# Patient Record
Sex: Female | Born: 1935 | Race: White | Hispanic: No | Smoking: Never smoker
Health system: Southern US, Community
[De-identification: ages and names within clinical notes are randomized; demographics above are authoritative.]

## PROBLEM LIST (undated history)

## (undated) DIAGNOSIS — R1310 Dysphagia, unspecified: Secondary | ICD-10-CM

## (undated) DIAGNOSIS — I4891 Unspecified atrial fibrillation: Secondary | ICD-10-CM

## (undated) DIAGNOSIS — Z950 Presence of cardiac pacemaker: Secondary | ICD-10-CM

## (undated) DIAGNOSIS — I10 Essential (primary) hypertension: Secondary | ICD-10-CM

## (undated) DIAGNOSIS — R4702 Dysphasia: Secondary | ICD-10-CM

## (undated) DIAGNOSIS — F0390 Unspecified dementia without behavioral disturbance: Secondary | ICD-10-CM

---

## 2019-11-14 ENCOUNTER — Emergency Department (HOSPITAL_COMMUNITY)
Admission: EM | Admit: 2019-11-14 | Discharge: 2019-11-14 | Disposition: A | Discharge status: Home / Self Care | Payer: Medicare Other | Attending: Emergency Medicine | Admitting: Emergency Medicine

## 2019-11-14 ENCOUNTER — Emergency Department (HOSPITAL_COMMUNITY): Payer: Medicare Other

## 2019-11-14 ENCOUNTER — Other Ambulatory Visit: Payer: Self-pay

## 2019-11-14 DIAGNOSIS — S4991XA Unspecified injury of right shoulder and upper arm, initial encounter: Secondary | ICD-10-CM | POA: Diagnosis present

## 2019-11-14 DIAGNOSIS — S43005A Unspecified dislocation of left shoulder joint, initial encounter: Secondary | ICD-10-CM

## 2019-11-14 DIAGNOSIS — Y92009 Unspecified place in unspecified non-institutional (private) residence as the place of occurrence of the external cause: Secondary | ICD-10-CM | POA: Insufficient documentation

## 2019-11-14 DIAGNOSIS — W108XXA Fall (on) (from) other stairs and steps, initial encounter: Secondary | ICD-10-CM | POA: Insufficient documentation

## 2019-11-14 DIAGNOSIS — W19XXXA Unspecified fall, initial encounter: Secondary | ICD-10-CM

## 2019-11-14 MED ORDER — OXYCODONE-ACETAMINOPHEN 5-325 MG PO TABS
1.0000 | ORAL_TABLET | Freq: Once | ORAL | Status: AC
  Administered 2019-11-14: 1 via ORAL
  Filled 2019-11-14: qty 1

## 2019-11-14 MED ORDER — HYDROCODONE-ACETAMINOPHEN 5-325 MG PO TABS: 1.0000 | ORAL_TABLET | Freq: Four times a day (QID) | ORAL | 0 refills | Status: AC | PRN

## 2019-11-14 MED ORDER — FENTANYL CITRATE (PF) 100 MCG/2ML IJ SOLN
50.0000 ug | Freq: Once | INTRAMUSCULAR | Status: AC
  Administered 2019-11-14: 50 ug via INTRAVENOUS
  Filled 2019-11-14: qty 2

## 2019-11-14 NOTE — Progress Notes (Signed)
Orthopedic Tech Progress Note Patient Details:  Tiffany Cowan Oct 28, 1923 [id number]  Ortho Devices Type of Ortho Device: Arm sling Ortho Device/Splint Location: LUE Ortho Device/Splint Interventions: Application, Adjustment   Post Interventions Patient Tolerated: Well Instructions Provided: Adjustment of device   Tiffany Cowan E [name] 11/14/2019, 9:21 PM

## 2019-11-14 NOTE — Discharge Instructions (Signed)
Please follow-up with orthopedic surgery.  Recommend no weightbearing of your left arm and continue sling until cleared by Ortho.  Take pain medicine as prescribed as needed.

## 2019-11-14 NOTE — ED Notes (Signed)
Patient transported to X-ray 

## 2019-11-15 NOTE — ED Provider Notes (Signed)
[facility] EMERGENCY DEPARTMENT Provider Note   CSN: [id number] Arrival date & time: 11/14/19  1724     History Chief Complaint  Patient presents with  . Fall    Tiffany Cowan is a 86 y.o. female.  Presented to ER with concern for fall.  Reports that this evening she fell into the banister, landed on her left side.  Thinks she may have lost her balance, denied losing any consciousness. No head trauma, no head pain or neck or back pain.  Denies any chest pain, difficulty breathing, abdominal pain, vomiting.  States she is only having shoulder pain.  Pain is worse with movement, sharp, stabbing.  Denies prior history of shoulder injury or prior dislocation.  Lives in independent living facility.  Daughter reports she has good family support and is able to help her mom home tonight, will have mother stay with her this evening.  HPI     No past medical history on file.  There are no problems to display for this patient.     OB History   No obstetric history on file.     No family history on file.  Social History   Tobacco Use  . Smoking status: Not on file  Substance Use Topics  . Alcohol use: Not on file  . Drug use: Not on file    Home Medications Prior to Admission medications   Medication Sig Start Date End Date Taking? Authorizing Provider  HYDROcodone-acetaminophen (NORCO/VICODIN) 5-325 MG tablet Take 1 tablet by mouth every 6 (six) hours as needed for up to 3 days for moderate pain. 11/14/19 11/17/19  Milagros Loll, MD    Allergies    Patient has no allergy information on record.  Review of Systems   Review of Systems  Constitutional: Negative for chills and fever.  HENT: Negative for ear pain and sore throat.   Eyes: Negative for pain and visual disturbance.  Respiratory: Negative for cough and shortness of breath.   Cardiovascular: Negative for chest pain and palpitations.  Gastrointestinal: Negative for abdominal pain and  vomiting.  Genitourinary: Negative for dysuria and hematuria.  Musculoskeletal: Positive for arthralgias. Negative for back pain.  Skin: Negative for color change and rash.  Neurological: Negative for seizures and syncope.  All other systems reviewed and are negative.   Physical Exam Updated Vital Signs BP (!) 149/103   Pulse 72   Temp (!) 97.5 F (36.4 C) (Oral)   Resp (!) 23   Ht 5\' 2"  (1.575 m)   Wt 59 kg   SpO2 95%   BMI 23.78 kg/m   Physical Exam Vitals and nursing note reviewed.  Constitutional:      General: She is not in acute distress.    Appearance: She is well-developed.  HENT:     Head: Normocephalic and atraumatic.  Eyes:     Conjunctiva/sclera: Conjunctivae normal.  Cardiovascular:     Rate and Rhythm: Normal rate and regular rhythm.     Heart sounds: No murmur heard.   Pulmonary:     Effort: Pulmonary effort is normal. No respiratory distress.     Breath sounds: Normal breath sounds.  Abdominal:     Palpations: Abdomen is soft.     Tenderness: There is no abdominal tenderness.  Musculoskeletal:     Cervical back: Neck supple.     Comments: LUE: deformity to L shoulder, ROM to shoulder decreased, no TTP throughout remainder of extremity, normal sensation, radial pulse RUE: no deformity,  no TTP  LLE: no deformity, no TTP RLE: no deformity, no TTP Back: no C,T,L spine TTP  Skin:    General: Skin is warm and dry.     Capillary Refill: Capillary refill takes less than 2 seconds.  Neurological:     General: No focal deficit present.     Mental Status: She is alert and oriented to person, place, and time.     Comments: Normal distal motor and sensation in LUE      ED Results / Procedures / Treatments   Labs (all labs ordered are listed, but only abnormal results are displayed) Labs Reviewed - No data to display  EKG None  Radiology DG Chest 1 View  Result Date: 11/14/2019 CLINICAL DATA:  Fall, left shoulder pain EXAM: CHEST  1 VIEW  COMPARISON:  Shoulder series today FINDINGS: Cardiomegaly. Pacer in place with leads in the right atrium and right ventricle. Aortic atherosclerosis. No confluent opacities or effusions. Anterior dislocation of the left shoulder again noted. IMPRESSION: No acute cardiopulmonary disease. Left shoulder anterior dislocation. Electronically Signed   By: Charlett Nose M.D.   On: 11/14/2019 19:11   DG Shoulder Left  Result Date: 11/14/2019 CLINICAL DATA:  Reduction of anterior dislocation EXAM: LEFT SHOULDER - 2+ VIEW COMPARISON:  11/14/2019 FINDINGS: Frontal, transscapular, and axillary views of the left shoulder are obtained. There is anatomic alignment of the glenohumeral joint. There is moderate osteoarthritis of the acromioclavicular and glenohumeral joints. No fracture. Left chest is clear. IMPRESSION: 1. Reduction of previous glenohumeral dislocation. 2. Moderate osteoarthritis. Electronically Signed   By: Sharlet Salina M.D.   On: 11/14/2019 21:06   DG Shoulder Left  Result Date: 11/14/2019 CLINICAL DATA:  Left shoulder pain, fall EXAM: LEFT SHOULDER - 2+ VIEW COMPARISON:  None. FINDINGS: There is anterior/inferior dislocation at the left glenohumeral joint. No visible fracture. AC joint appears intact with mild degenerative changes. IMPRESSION: Left anterior/inferior dislocation. Electronically Signed   By: Charlett Nose M.D.   On: 11/14/2019 19:10    Procedures .Ortho Injury Treatment  Date/Time: 11/15/2019 4:24 PM Performed by: Milagros Loll, MD Authorized by: Milagros Loll, MD   Consent:    Consent obtained:  Verbal   Consent given by:  Patient   Risks discussed:  Fracture, irreducible dislocation, recurrent dislocation and nerve damage   Alternatives discussed:  No treatment, alternative treatment and immobilizationInjury location: shoulder Location details: left shoulder Injury type: dislocation Dislocation type: anterior Hill-Sachs deformity: no Chronicity:  new Pre-procedure distal perfusion: normal Pre-procedure neurological function: normal Pre-procedure range of motion: normal  Anesthesia: Local anesthesia used: no  Patient sedated: NoManipulation performed: yes Reduction method: Cunningham technique. Reduction successful: yes X-ray confirmed reduction: yes Immobilization: sling Post-procedure neurovascular assessment: post-procedure neurovascularly intact Post-procedure distal perfusion: normal Post-procedure neurological function: normal Post-procedure range of motion: normal Patient tolerance: patient tolerated the procedure well with no immediate complications    (including critical care time)  Medications Ordered in ED Medications  fentaNYL (SUBLIMAZE) injection 50 mcg (50 mcg Intravenous Given 11/14/19 1822)  oxyCODONE-acetaminophen (PERCOCET/ROXICET) 5-325 MG per tablet 1 tablet (1 tablet Oral Given 11/14/19 2014)    ED Course  I have reviewed the triage vital signs and the nursing notes.  Pertinent labs & imaging results that were available during my care of the patient were reviewed by me and considered in my medical decision making (see chart for details).    MDM Rules/Calculators/A&P  86 year old lady presented to ER after mechanical fall.  She is alert and oriented, denied head trauma, LOC.  On head to toe trauma assessment, only traumatic findings were deformed left shoulder.  X-ray consistent with anterior shoulder dislocation.  Shoulder was easily reduced at bedside.  Placed in sling, instructed to follow-up with Ortho.  Daughter at bedside will take patient home with her this evening.  After the discussed management above, the patient was determined to be safe for discharge.  The patient was in agreement with this plan and all questions regarding their care were answered.  ED return precautions were discussed and the patient will return to the ED with any significant worsening of  condition.    Final Clinical Impression(s) / ED Diagnoses Final diagnoses:  Dislocation of left shoulder joint, initial encounter    Rx / DC Orders ED Discharge Orders         Ordered    HYDROcodone-acetaminophen (NORCO/VICODIN) 5-325 MG tablet  Every 6 hours PRN        11/14/19 2118           Milagros Loll, MD 11/15/19 1625

## 2020-01-26 ENCOUNTER — Other Ambulatory Visit: Payer: Self-pay

## 2020-01-26 ENCOUNTER — Emergency Department (HOSPITAL_COMMUNITY): Payer: Medicare Other

## 2020-01-26 ENCOUNTER — Ambulatory Visit (HOSPITAL_COMMUNITY)
Admission: EM | Admit: 2020-01-26 | Discharge: 2020-01-26 | Disposition: A | Discharge status: Other Acute Inpatient Hospital | Payer: Medicare Other

## 2020-01-26 ENCOUNTER — Emergency Department (HOSPITAL_COMMUNITY)
Admission: EM | Admit: 2020-01-26 | Discharge: 2020-01-26 | Disposition: A | Discharge status: Home / Self Care | Payer: Medicare Other | Attending: Emergency Medicine | Admitting: Emergency Medicine

## 2020-01-26 DIAGNOSIS — S0990XA Unspecified injury of head, initial encounter: Secondary | ICD-10-CM | POA: Diagnosis present

## 2020-01-26 DIAGNOSIS — W228XXA Striking against or struck by other objects, initial encounter: Secondary | ICD-10-CM | POA: Insufficient documentation

## 2020-01-26 DIAGNOSIS — S60221A Contusion of right hand, initial encounter: Secondary | ICD-10-CM | POA: Diagnosis not present

## 2020-01-26 DIAGNOSIS — S0003XA Contusion of scalp, initial encounter: Secondary | ICD-10-CM | POA: Diagnosis not present

## 2020-01-26 DIAGNOSIS — I4891 Unspecified atrial fibrillation: Secondary | ICD-10-CM | POA: Insufficient documentation

## 2020-01-26 DIAGNOSIS — Y92129 Unspecified place in nursing home as the place of occurrence of the external cause: Secondary | ICD-10-CM | POA: Diagnosis not present

## 2020-01-26 DIAGNOSIS — Z7901 Long term (current) use of anticoagulants: Secondary | ICD-10-CM | POA: Diagnosis not present

## 2020-01-26 NOTE — ED Notes (Signed)
Patient transported to CT 

## 2020-01-26 NOTE — Progress Notes (Signed)
Chaplain responding to page for Trauma level 2 for pt Tiffany Cowan. Doctor in room with pt and pt's daughter upon arrival. This chaplain spoke with RN who shared that chaplain services were not needed at this time as pt is "stable". Chaplain remains available for follow-up spiritual/emotional support as needed.  Ardath Sax, MDiv     01/26/20 1800  Clinical Encounter Type  Visited With Health care provider  Visit Type Initial;Trauma

## 2020-01-26 NOTE — ED Triage Notes (Signed)
Pt fell at nursing home around 3pm while trying to make her bed.  Hit the back of her head.  Hematoma to back of her head.  + blood thinners.  No LOC.  Level 2 activated.

## 2020-01-26 NOTE — Discharge Instructions (Signed)
Your testing is normal No signs of brain injury or fracture of the hand Tylenol for pain as needed  See your doctor in 1 week for recheck as needed  Ice and rest to the scalp.

## 2020-01-26 NOTE — ED Provider Notes (Signed)
[facility] EMERGENCY DEPARTMENT Provider Note   CSN: [id number] Arrival date & time: 01/26/20  1720     History No chief complaint on file.   Tiffany Cowan is a 87 y.o. female.  HPI    This pt has hx of afib - on xarelto - in assisted care, using her walker and trying to make her bed when she fell backwards and hit her posteiror occiput - no LOC that she remembers - no other sx  She had a noticed hematoma to the posteior scalp - no change in mental status, no vomiting, no visual changes - occurred a short time ago - sx are mild and worse with palpation over the hematoma - no break in the skin.   No past medical history on file.  There are no problems to display for this patient.    OB History   No obstetric history on file.     No family history on file.     Home Medications Prior to Admission medications   Medication Sig Start Date End Date Taking? Authorizing Provider  cloNIDine (CATAPRES) 0.1 MG tablet Take 0.1 mg by mouth daily as needed. 11/23/19  Yes [provider]  LORazepam (ATIVAN) 1 MG tablet Take 1 mg by mouth at bedtime. 01/18/20  Yes [provider]  pantoprazole (PROTONIX) 40 MG tablet Take 40 mg by mouth daily. 01/22/20  Yes [provider]  rivaroxaban (XARELTO) 20 MG TABS tablet Take 20 mg by mouth daily.   Yes [provider]  gabapentin (NEURONTIN) 100 MG capsule Take 100 mg by mouth at bedtime. 01/13/20   [provider]    Allergies    Azithromycin and Penicillins  Review of Systems   Review of Systems  All other systems reviewed and are negative.   Physical Exam Updated Vital Signs BP (!) 193/90   Pulse 76   Temp 98.4 F (36.9 C) (Oral)   Resp (!) 22   Ht 1.6 m (5\' 3" )   Wt 59 kg   SpO2 96%   BMI 23.03 kg/m   Physical Exam Vitals and nursing note reviewed.  Constitutional:      Appearance: She is well-developed and well-nourished. She is not diaphoretic.  HENT:      Head: Normocephalic.     Comments: 4 cm in diameter hematoma to the occipital scalp.  No other trauma to the head, normal facial symmetry without bruising or trauma, or malocclusion. Eyes:     General:        Right eye: No discharge.        Left eye: No discharge.     Conjunctiva/sclera: Conjunctivae normal.  Cardiovascular:     Rate and Rhythm: Normal rate and regular rhythm.  Pulmonary:     Effort: Pulmonary effort is normal. No respiratory distress.  Abdominal:     General: Abdomen is flat.     Tenderness: There is no abdominal tenderness.  Musculoskeletal:        General: Tenderness ( ttp and bruising over the R hand - thumb and 2nd MCP.) present. No swelling.     Right lower leg: No edema.     Left lower leg: No edema.     Comments: All other joints are supple and compartments are soft - no ttp over the spine  Skin:    General: Skin is warm and dry.     Findings: No erythema or rash.  Neurological:     Mental Status: She  is alert.     Coordination: Coordination normal.     Comments: Follows commands - baseline hard of hearing - normal strength in all 4 extremities.  Psychiatric:        Mood and Affect: Mood and affect normal.     ED Results / Procedures / Treatments   Labs (all labs ordered are listed, but only abnormal results are displayed) Labs Reviewed - No data to display  EKG EKG Interpretation  Date/Time:  Saturday January 26 2020 18:28:10 EST Ventricular Rate:  72 PR Interval:    QRS Duration: 146 QT Interval:  442 QTC Calculation: 484 R Axis:   -64 Text Interpretation: Sinus rhythm Left bundle branch block No old tracing to compare Confirmed by Eber Hong ([id number]) on 01/26/2020 6:35:42 PM   Radiology CT Head Wo Contrast  Result Date: 01/26/2020 CLINICAL DATA:  Head injury after fall. EXAM: CT HEAD WITHOUT CONTRAST TECHNIQUE: Contiguous axial images were obtained from the base of the skull through the vertex without intravenous contrast. COMPARISON:   None. FINDINGS: Brain: Mild chronic ischemic white matter disease is noted. No mass effect or midline shift is noted. Ventricular size is within normal limits. There is no evidence of mass lesion, hemorrhage or acute infarction. Vascular: No hyperdense vessel or unexpected calcification. Skull: Normal. Negative for fracture or focal lesion. Sinuses/Orbits: No acute finding. Other: Small right posterior scalp hematoma is noted. IMPRESSION: Mild chronic ischemic white matter disease. Small right posterior scalp hematoma. No acute intracranial abnormality seen. Electronically Signed   By: Lupita Raider M.D.   On: 01/26/2020 19:20   DG Hand Complete Right  Result Date: 01/26/2020 CLINICAL DATA:  Right hand pain after fall. EXAM: RIGHT HAND - COMPLETE 3+ VIEW COMPARISON:  None. FINDINGS: There is no evidence of fracture or dislocation. There is no evidence of arthropathy or other focal bone abnormality. Soft tissues are unremarkable. IMPRESSION: Negative. Electronically Signed   By: Lupita Raider M.D.   On: 01/26/2020 19:23    Procedures Procedures (including critical care time)  Medications Ordered in ED Medications - No data to display  ED Course  I have reviewed the triage vital signs and the nursing notes.  Pertinent labs & imaging results that were available during my care of the patient were reviewed by me and considered in my medical decision making (see chart for details).  Clinical Course as of 01/26/20 1954  Sat Jan 26, 2020  1853 Blood pressure improved at 189/78.  EKG unremarkable [BM]    Clinical Course User Index [BM] Eber Hong, MD   MDM Rules/Calculators/A&P                          This patient will need an evaluation for possible intracranial injury given her anticoagulated state with a hematoma from the fall.  She is otherwise well-appearing with normal mental status.  She is hypertensive but this is not unusual per the daughter and in this acute situation brought to  the ER I do not think this is part of her condition.  She is not tachycardic and has no other symptoms.  She is stable appearing at this time  CT pending  Personally viewed and interpreted the CT scan and the x-ray of the hand, there is no signs of fractures dislocations bleeding or other significant findings.  I agree with the radiologist.  This patient is stable for discharge  Final Clinical Impression(s) / ED Diagnoses Final diagnoses:  Hematoma of occipital region of scalp  Minor head injury, initial encounter  Contusion of right hand, initial encounter    Rx / DC Orders ED Discharge Orders    None       Eber Hong, MD 01/26/20 1954

## 2020-05-13 ENCOUNTER — Emergency Department (HOSPITAL_COMMUNITY)
Admission: EM | Admit: 2020-05-13 | Discharge: 2020-05-13 | Disposition: A | Discharge status: Home / Self Care | Payer: Medicare Other | Attending: Emergency Medicine | Admitting: Emergency Medicine

## 2020-05-13 ENCOUNTER — Encounter (HOSPITAL_COMMUNITY): Payer: Self-pay

## 2020-05-13 ENCOUNTER — Emergency Department (HOSPITAL_COMMUNITY): Payer: Medicare Other

## 2020-05-13 DIAGNOSIS — F0390 Unspecified dementia without behavioral disturbance: Secondary | ICD-10-CM | POA: Insufficient documentation

## 2020-05-13 DIAGNOSIS — I10 Essential (primary) hypertension: Secondary | ICD-10-CM | POA: Diagnosis not present

## 2020-05-13 DIAGNOSIS — Z79899 Other long term (current) drug therapy: Secondary | ICD-10-CM | POA: Insufficient documentation

## 2020-05-13 DIAGNOSIS — S0003XA Contusion of scalp, initial encounter: Secondary | ICD-10-CM | POA: Insufficient documentation

## 2020-05-13 DIAGNOSIS — G8929 Other chronic pain: Secondary | ICD-10-CM | POA: Insufficient documentation

## 2020-05-13 DIAGNOSIS — W19XXXA Unspecified fall, initial encounter: Secondary | ICD-10-CM | POA: Insufficient documentation

## 2020-05-13 DIAGNOSIS — Z7901 Long term (current) use of anticoagulants: Secondary | ICD-10-CM | POA: Diagnosis not present

## 2020-05-13 DIAGNOSIS — I4891 Unspecified atrial fibrillation: Secondary | ICD-10-CM | POA: Insufficient documentation

## 2020-05-13 DIAGNOSIS — M549 Dorsalgia, unspecified: Secondary | ICD-10-CM | POA: Insufficient documentation

## 2020-05-13 DIAGNOSIS — Y9301 Activity, walking, marching and hiking: Secondary | ICD-10-CM | POA: Diagnosis not present

## 2020-05-13 DIAGNOSIS — S0990XA Unspecified injury of head, initial encounter: Secondary | ICD-10-CM | POA: Diagnosis present

## 2020-05-13 HISTORY — DX: Unspecified atrial fibrillation: I48.91

## 2020-05-13 HISTORY — DX: Presence of cardiac pacemaker: Z95.0

## 2020-05-13 HISTORY — DX: Dysphasia: R47.02

## 2020-05-13 HISTORY — DX: Unspecified dementia, unspecified severity, without behavioral disturbance, psychotic disturbance, mood disturbance, and anxiety: F03.90

## 2020-05-13 HISTORY — DX: Essential (primary) hypertension: I10

## 2020-05-13 MED ORDER — ACETAMINOPHEN 325 MG PO TABS
650.0000 mg | ORAL_TABLET | Freq: Once | ORAL | Status: AC
  Administered 2020-05-13: 650 mg via ORAL
  Filled 2020-05-13: qty 2

## 2020-05-13 NOTE — ED Provider Notes (Signed)
Emergency Medicine Provider Triage Evaluation Note  Tiffany Cowan 87 y.o. female was evaluated in triage. Patient comes from [facility].  Pt complains of an unwitnessed fall that occurred today. Patient is not on blood thinners. She has a history of dementia and is at baseline. EMS reports hematoma noted to the head.   Review of Systems  Positive: fall Negative: N/A  Physical Exam  BP 134/82   Pulse 70   Temp 98.2 F (36.8 C) (Oral)   Resp 18   Ht 5\' 4"  (1.626 m)   Wt 65.8 kg   SpO2 100%   BMI 24.89 kg/m  Gen:   Awake, no distress   HEENT:  Hematoma noted to the right posterior head. No underlying skull deformity or crepitus. Full flexion/extension and lateral movement of neck fully intact. No bony midline tenderness. No deformities or crepitus.  Resp:  Normal effort  Cardiac:  Normal rate  Abd:   Nondistended, nontender  MSK:   Moves extremities without difficulty  Neuro:  Speech clear. CN III-XII intact. 5/5 BUE and BLE.   Medical Decision Making  Medically screening exam initiated at 3:55 AM.  Appropriate orders placed.  Tiffany Cowan was informed that the remainder of the evaluation will be completed by another provider, this initial triage assessment does not replace that evaluation, and the importance of remaining in the ED until their evaluation is complete.  Clinical Impression  Fall   Portions of this note were generated with Dragon dictation software. Dictation errors may occur despite best attempts at proofreading.     Tinnie Gens, PA-C 05/13/20 1655    07/13/20, MD 05/16/20 [phone]

## 2020-05-13 NOTE — Discharge Instructions (Signed)
Take Tylenol for headaches.  Your CT scan today was unremarkable   Fall precaution  Return to ER if you have worse headaches, vomiting, another fall

## 2020-05-13 NOTE — ED Provider Notes (Signed)
[facility]-EMERGENCY DEPT Provider Note   CSN: [id number] Arrival date & time: 05/13/20  1640     History Chief Complaint  Patient presents with  . Fall    Tiffany Cowan is a 87 y.o. female history of dementia, hypertension here presenting with fall.  Patient is from assisted living.  Apparently she was walking back to her room and she states that she had missed the bed and fell backwards.  She was noted to have a hematoma in the posterior scalp area.  She is no longer on blood thinners.  She denies any headaches and per the daughter, patient's mental status is at baseline.  Patient has chronic back pain but denies any back injury.  Patient was ambulatory after the incident.   The history is provided by the patient and a relative.       Past Medical History:  Diagnosis Date  . A-fib (HCC)   . Dementia (HCC)   . Dysphasia   . Hypertension   . Pacemaker     There are no problems to display for this patient.   History reviewed. No pertinent surgical history.   OB History   No obstetric history on file.     History reviewed. No pertinent family history.     Home Medications Prior to Admission medications   Medication Sig Start Date End Date Taking? Authorizing Provider  acetaminophen (TYLENOL) 325 MG tablet Take 650 mg by mouth every 6 (six) hours as needed for mild pain or moderate pain.    [provider]  albuterol (VENTOLIN HFA) 108 (90 Base) MCG/ACT inhaler Inhale 2 puffs into the lungs 2 (two) times daily as needed for wheezing or shortness of breath.    [provider]  amiodarone (PACERONE) 200 MG tablet Take 100 mg by mouth daily.    [provider]  Capsaicin (ASPERCREME PAIN RELIEF PATCH EX) Apply 1 patch topically daily. Apply to lower back    [provider]  cloNIDine (CATAPRES) 0.1 MG tablet Take 0.1 mg by mouth daily as needed. 11/23/19   [provider]  dextromethorphan (DELSYM) 30 MG/5ML  liquid Take 15 mg by mouth every 8 (eight) hours as needed for cough.    [provider]  gabapentin (NEURONTIN) 100 MG capsule Take 100 mg by mouth at bedtime. 01/13/20   [provider]  guaiFENesin (MUCINEX) 600 MG 12 hr tablet Take 600 mg by mouth 2 (two) times daily as needed for cough.    [provider]  levothyroxine (SYNTHROID) 50 MCG tablet Take 50 mcg by mouth daily.    [provider]  LORazepam (ATIVAN) 1 MG tablet Take 1 mg by mouth at bedtime. 01/18/20   [provider]  Menthol, Topical Analgesic, (BIOFREEZE) 4 % GEL Apply 1 application topically 2 (two) times daily as needed (pain). Apply to lower back    [provider]  metoprolol tartrate (LOPRESSOR) 25 MG tablet Take 25 mg by mouth 2 (two) times daily.    [provider]  pantoprazole (PROTONIX) 40 MG tablet Take 40 mg by mouth daily. 01/22/20   [provider]  Psyllium (METAMUCIL) 0.36 g CAPS Take 1 capsule by mouth at bedtime.    [provider]  rivaroxaban (XARELTO) 20 MG TABS tablet Take 20 mg by mouth daily.    [provider]    Allergies    Azithromycin and Penicillins  Review of Systems   Review of Systems  HENT:  Scalp hematoma   All other systems reviewed and are negative.   Physical Exam Updated Vital Signs BP (!) 193/83 (BP Location: Left Arm)   Pulse 73   Temp 98.1 F (36.7 C) (Oral)   Resp 16   SpO2 98%   Physical Exam Vitals and nursing note reviewed.  Constitutional:      Appearance: Normal appearance.  HENT:     Head: Normocephalic.     Comments: + posterior scalp hematoma     Nose: Nose normal.     Mouth/Throat:     Mouth: Mucous membranes are moist.  Eyes:     Extraocular Movements: Extraocular movements intact.     Pupils: Pupils are equal, round, and reactive to light.  Neck:     Comments: No midline tenderness Cardiovascular:     Rate and Rhythm: Normal rate and regular rhythm.      Pulses: Normal pulses.     Heart sounds: Normal heart sounds.  Pulmonary:     Effort: Pulmonary effort is normal.     Breath sounds: Normal breath sounds.  Abdominal:     General: Abdomen is flat.     Palpations: Abdomen is soft.  Musculoskeletal:        General: Normal range of motion.     Cervical back: Normal range of motion.     Comments: No obvious midline tenderness.  Obvious extremity trauma  Skin:    General: Skin is warm.     Capillary Refill: Capillary refill takes less than 2 seconds.  Neurological:     Mental Status: She is alert.     Comments: ANO x2, patient has no obvious facial droop.  Normal strength and sensation bilateral upper and lower extremities  Psychiatric:        Mood and Affect: Mood normal.        Behavior: Behavior normal.     ED Results / Procedures / Treatments   Labs (all labs ordered are listed, but only abnormal results are displayed) Labs Reviewed - No data to display  EKG None  Radiology CT Head Wo Contrast  Result Date: 05/13/2020 CLINICAL DATA:  Head trauma, fall EXAM: CT HEAD WITHOUT CONTRAST CT CERVICAL SPINE WITHOUT CONTRAST TECHNIQUE: Multidetector CT imaging of the head and cervical spine was performed following the standard protocol without intravenous contrast. Multiplanar CT image reconstructions of the cervical spine were also generated. COMPARISON:  CT brain 01/26/2020 FINDINGS: CT HEAD FINDINGS Brain: No acute territorial infarction, hemorrhage or intracranial mass. Moderate atrophy. Advanced white matter disease likely chronic small vessel ischemic change. Stable ventricle size. Vascular: No hyperdense vessels.  Carotid vascular calcification Skull: Normal. Negative for fracture or focal lesion. Sinuses/Orbits: No acute finding. Other: Right posterior scalp hematoma CT CERVICAL SPINE FINDINGS Alignment: Reversal of cervical lordosis. 2 mm anterolisthesis C2 on C3, trace retrolisthesis C4 on C5. Facet alignment is maintained. Skull  base and vertebrae: No acute fracture. No primary bone lesion or focal pathologic process. Soft tissues and spinal canal: No prevertebral fluid or swelling. No visible canal hematoma. Disc levels: Moderate severe diffuse degenerative changes C3 through C7. Hypertrophic facet degenerative changes at multiple levels with foraminal stenosis. Upper chest: Negative. Other: None IMPRESSION: 1. No CT evidence for acute intracranial abnormality. Atrophy and chronic small vessel ischemic changes of the white matter. Right posterior scalp hematoma 2. Reversal of cervical lordosis with moderate severe diffuse degenerative changes. No acute osseous abnormality. Electronically Signed   By: Jasmine Pang M.D.   On: 05/13/2020  18:48   CT Cervical Spine Wo Contrast  Result Date: 05/13/2020 CLINICAL DATA:  Head trauma, fall EXAM: CT HEAD WITHOUT CONTRAST CT CERVICAL SPINE WITHOUT CONTRAST TECHNIQUE: Multidetector CT imaging of the head and cervical spine was performed following the standard protocol without intravenous contrast. Multiplanar CT image reconstructions of the cervical spine were also generated. COMPARISON:  CT brain 01/26/2020 FINDINGS: CT HEAD FINDINGS Brain: No acute territorial infarction, hemorrhage or intracranial mass. Moderate atrophy. Advanced white matter disease likely chronic small vessel ischemic change. Stable ventricle size. Vascular: No hyperdense vessels.  Carotid vascular calcification Skull: Normal. Negative for fracture or focal lesion. Sinuses/Orbits: No acute finding. Other: Right posterior scalp hematoma CT CERVICAL SPINE FINDINGS Alignment: Reversal of cervical lordosis. 2 mm anterolisthesis C2 on C3, trace retrolisthesis C4 on C5. Facet alignment is maintained. Skull base and vertebrae: No acute fracture. No primary bone lesion or focal pathologic process. Soft tissues and spinal canal: No prevertebral fluid or swelling. No visible canal hematoma. Disc levels: Moderate severe diffuse  degenerative changes C3 through C7. Hypertrophic facet degenerative changes at multiple levels with foraminal stenosis. Upper chest: Negative. Other: None IMPRESSION: 1. No CT evidence for acute intracranial abnormality. Atrophy and chronic small vessel ischemic changes of the white matter. Right posterior scalp hematoma 2. Reversal of cervical lordosis with moderate severe diffuse degenerative changes. No acute osseous abnormality. Electronically Signed   By: Jasmine Pang M.D.   On: 05/13/2020 18:48    Procedures Procedures   Medications Ordered in ED Medications  acetaminophen (TYLENOL) tablet 650 mg (650 mg Oral Given 05/13/20 1812)    ED Course  I have reviewed the triage vital signs and the nursing notes.  Pertinent labs & imaging results that were available during my care of the patient were reviewed by me and considered in my medical decision making (see chart for details).    MDM Rules/Calculators/A&P                         Suni Jarnagin is a 87 y.o. female here presenting with fall.  Had a mechanical fall and has a scalp hematoma.  Patient is at baseline mental status.  Will get CT head and neck.  7:03 PM CT unremarkable.  Stable for discharge back to facility  Final Clinical Impression(s) / ED Diagnoses Final diagnoses:  None    Rx / DC Orders ED Discharge Orders    None       Charlynne Pander, MD 05/13/20 1904

## 2020-05-13 NOTE — ED Triage Notes (Signed)
Pt presents with c/o fall that occurred today. Pt is from [facility]. Unwitnessed fall, pt has a hematoma to the back right side of her head. Pt does have dementia, is at her baseline at this time, no blood thinners.

## 2020-05-26 ENCOUNTER — Emergency Department (HOSPITAL_COMMUNITY): Payer: Medicare Other

## 2020-05-26 ENCOUNTER — Encounter (HOSPITAL_COMMUNITY): Payer: Self-pay | Admitting: Emergency Medicine

## 2020-05-26 ENCOUNTER — Other Ambulatory Visit: Payer: Self-pay

## 2020-05-26 ENCOUNTER — Emergency Department (HOSPITAL_COMMUNITY)
Admission: EM | Admit: 2020-05-26 | Discharge: 2020-05-26 | Disposition: A | Discharge status: Home / Self Care | Payer: Medicare Other | Attending: Emergency Medicine | Admitting: Emergency Medicine

## 2020-05-26 DIAGNOSIS — N390 Urinary tract infection, site not specified: Secondary | ICD-10-CM | POA: Insufficient documentation

## 2020-05-26 DIAGNOSIS — F909 Attention-deficit hyperactivity disorder, unspecified type: Secondary | ICD-10-CM | POA: Insufficient documentation

## 2020-05-26 DIAGNOSIS — Z79899 Other long term (current) drug therapy: Secondary | ICD-10-CM | POA: Insufficient documentation

## 2020-05-26 DIAGNOSIS — F419 Anxiety disorder, unspecified: Secondary | ICD-10-CM | POA: Insufficient documentation

## 2020-05-26 DIAGNOSIS — R339 Retention of urine, unspecified: Secondary | ICD-10-CM | POA: Insufficient documentation

## 2020-05-26 DIAGNOSIS — F0390 Unspecified dementia without behavioral disturbance: Secondary | ICD-10-CM | POA: Insufficient documentation

## 2020-05-26 DIAGNOSIS — Z7901 Long term (current) use of anticoagulants: Secondary | ICD-10-CM | POA: Diagnosis not present

## 2020-05-26 DIAGNOSIS — R4182 Altered mental status, unspecified: Secondary | ICD-10-CM | POA: Diagnosis present

## 2020-05-26 DIAGNOSIS — F0391 Unspecified dementia with behavioral disturbance: Secondary | ICD-10-CM

## 2020-05-26 DIAGNOSIS — B9689 Other specified bacterial agents as the cause of diseases classified elsewhere: Secondary | ICD-10-CM | POA: Insufficient documentation

## 2020-05-26 DIAGNOSIS — I10 Essential (primary) hypertension: Secondary | ICD-10-CM | POA: Diagnosis not present

## 2020-05-26 LAB — CBG MONITORING, ED: Glucose-Capillary: 108 mg/dL — ABNORMAL HIGH (ref 70–99)

## 2020-05-26 LAB — COMPREHENSIVE METABOLIC PANEL
ALT: 13 U/L (ref 0–44)
AST: 19 U/L (ref 15–41)
Albumin: 4.1 g/dL (ref 3.5–5.0)
Alkaline Phosphatase: 43 U/L (ref 38–126)
Anion gap: 9 (ref 5–15)
BUN: 36 mg/dL — ABNORMAL HIGH (ref 8–23)
CO2: 25 mmol/L (ref 22–32)
Calcium: 9.4 mg/dL (ref 8.9–10.3)
Chloride: 101 mmol/L (ref 98–111)
Creatinine, Ser: 1.35 mg/dL — ABNORMAL HIGH (ref 0.44–1.00)
GFR, Estimated: 36 mL/min — ABNORMAL LOW (ref >60)
Glucose, Bld: 112 mg/dL — ABNORMAL HIGH (ref 70–99)
Potassium: 4.2 mmol/L (ref 3.5–5.1)
Sodium: 135 mmol/L (ref 135–145)
Total Bilirubin: 0.5 mg/dL (ref 0.3–1.2)
Total Protein: 7 g/dL (ref 6.5–8.1)

## 2020-05-26 LAB — URINALYSIS, ROUTINE W REFLEX MICROSCOPIC
Bilirubin Urine: NEGATIVE
Glucose, UA: NEGATIVE mg/dL
Hgb urine dipstick: NEGATIVE
Ketones, ur: 5 mg/dL — AB
Nitrite: NEGATIVE
Protein, ur: NEGATIVE mg/dL
Specific Gravity, Urine: 1.013 (ref 1.005–1.030)
pH: 5 (ref 5.0–8.0)

## 2020-05-26 LAB — CBC
HCT: 39.4 % (ref 36.0–46.0)
Hemoglobin: 13 g/dL (ref 12.0–15.0)
MCH: 30.9 pg (ref 26.0–34.0)
MCHC: 33 g/dL (ref 30.0–36.0)
MCV: 93.6 fL (ref 80.0–100.0)
Platelets: 250 10*3/uL (ref 150–400)
RBC: 4.21 MIL/uL (ref 3.87–5.11)
RDW: 13.4 % (ref 11.5–15.5)
WBC: 8.1 10*3/uL (ref 4.0–10.5)
nRBC: 0 % (ref 0.0–0.2)

## 2020-05-26 MED ORDER — METOPROLOL TARTRATE 25 MG PO TABS
25.0000 mg | ORAL_TABLET | Freq: Once | ORAL | Status: AC
  Administered 2020-05-26: 25 mg via ORAL
  Filled 2020-05-26: qty 1

## 2020-05-26 MED ORDER — LORAZEPAM 2 MG/ML IJ SOLN
1.0000 mg | Freq: Once | INTRAMUSCULAR | Status: AC
  Administered 2020-05-26: 1 mg via INTRAMUSCULAR
  Filled 2020-05-26: qty 1

## 2020-05-26 MED ORDER — LORAZEPAM 1 MG PO TABS: 1.0000 mg | ORAL_TABLET | Freq: Three times a day (TID) | ORAL | 0 refills | Status: AC | PRN

## 2020-05-26 NOTE — ED Notes (Signed)
Pt placed on purewick 

## 2020-05-26 NOTE — ED Provider Notes (Addendum)
[facility]-EMERGENCY DEPT Provider Note   CSN: [id number] Arrival date & time: 05/26/20  1455     History Chief Complaint  Patient presents with  . Altered Mental Status    Tiffany Cowan is a 87 y.o. female.  87 year old female with history of A. fib on Xarelto and dementia who presents with altered mental status involving increased tremors.  According to her daughter, patient is seem more scared and has had increased anxiety.  Seen in the ED 13 days ago after a fall and had a negative head CT.  Patient has had some issues with recurrent UTIs due to urinary retention.  No reported fever, emesis.  Although patient does have dementia, patient cannot carry conversation at baseline.  Today daughter felt that her behavior was a lot different than yesterday        Past Medical History:  Diagnosis Date  . A-fib (HCC)   . Dementia (HCC)   . Dysphasia   . Hypertension   . Pacemaker     There are no problems to display for this patient.   History reviewed. No pertinent surgical history.   OB History   No obstetric history on file.     History reviewed. No pertinent family history.  Social History   Tobacco Use  . Smoking status: Never Smoker  . Smokeless tobacco: Never Used  Substance Use Topics  . Alcohol use: Not Currently  . Drug use: Never    Home Medications Prior to Admission medications   Medication Sig Start Date End Date Taking? Authorizing Provider  acetaminophen (TYLENOL) 325 MG tablet Take 650 mg by mouth every 6 (six) hours as needed for mild pain or moderate pain.    [provider]  albuterol (VENTOLIN HFA) 108 (90 Base) MCG/ACT inhaler Inhale 2 puffs into the lungs 2 (two) times daily as needed for wheezing or shortness of breath.    [provider]  amiodarone (PACERONE) 200 MG tablet Take 100 mg by mouth daily.    [provider]  Capsaicin (ASPERCREME PAIN RELIEF PATCH EX) Apply 1 patch topically  daily. Apply to lower back    [provider]  cloNIDine (CATAPRES) 0.1 MG tablet Take 0.1 mg by mouth daily as needed. 11/23/19   [provider]  dextromethorphan (DELSYM) 30 MG/5ML liquid Take 15 mg by mouth every 8 (eight) hours as needed for cough.    [provider]  gabapentin (NEURONTIN) 100 MG capsule Take 100 mg by mouth at bedtime. 01/13/20   [provider]  guaiFENesin (MUCINEX) 600 MG 12 hr tablet Take 600 mg by mouth 2 (two) times daily as needed for cough.    [provider]  levothyroxine (SYNTHROID) 50 MCG tablet Take 50 mcg by mouth daily.    [provider]  LORazepam (ATIVAN) 1 MG tablet Take 1 mg by mouth at bedtime. 01/18/20   [provider]  Menthol, Topical Analgesic, (BIOFREEZE) 4 % GEL Apply 1 application topically 2 (two) times daily as needed (pain). Apply to lower back    [provider]  metoprolol tartrate (LOPRESSOR) 25 MG tablet Take 25 mg by mouth 2 (two) times daily.    [provider]  pantoprazole (PROTONIX) 40 MG tablet Take 40 mg by mouth daily. 01/22/20   [provider]  Psyllium (METAMUCIL) 0.36 g CAPS Take 1 capsule by mouth at bedtime.    [provider]  rivaroxaban (XARELTO) 20 MG TABS tablet Take 20 mg by  mouth daily.    [provider]    Allergies    Azithromycin and Penicillins  Review of Systems   Review of Systems  All other systems reviewed and are negative.   Physical Exam Updated Vital Signs BP (!) 214/94 (BP Location: Right Arm)   Pulse 77   Temp 98.3 F (36.8 C) (Oral)   Resp 18   Ht 1.575 m (5\' 2" )   Wt 56.7 kg   SpO2 99%   BMI 22.86 kg/m   Physical Exam Vitals and nursing note reviewed.  Constitutional:      General: She is not in acute distress.    Appearance: Normal appearance. She is well-developed. She is not toxic-appearing.  HENT:     Head: Normocephalic and atraumatic.  Eyes:     General: Lids are normal.      Conjunctiva/sclera: Conjunctivae normal.     Pupils: Pupils are equal, round, and reactive to light.  Neck:     Thyroid: No thyroid mass.     Trachea: No tracheal deviation.  Cardiovascular:     Rate and Rhythm: Normal rate and regular rhythm.     Heart sounds: Normal heart sounds. No murmur heard. No gallop.   Pulmonary:     Effort: Pulmonary effort is normal. No respiratory distress.     Breath sounds: Normal breath sounds. No stridor. No decreased breath sounds, wheezing, rhonchi or rales.  Abdominal:     General: Bowel sounds are normal. There is no distension.     Palpations: Abdomen is soft.     Tenderness: There is no abdominal tenderness. There is no rebound.  Musculoskeletal:        General: No tenderness. Normal range of motion.     Cervical back: Normal range of motion and neck supple.  Skin:    General: Skin is warm and dry.     Findings: No abrasion or rash.  Neurological:     General: No focal deficit present.     Mental Status: She is alert and oriented to person, place, and time. Mental status is at baseline.     GCS: GCS eye subscore is 4. GCS verbal subscore is 5. GCS motor subscore is 6.     Cranial Nerves: No cranial nerve deficit.     Sensory: No sensory deficit.  Psychiatric:        Attention and Perception: Attention normal.        Mood and Affect: Mood is anxious.        Speech: Speech is rapid and pressured.        Behavior: Behavior is hyperactive. Behavior is not combative.     ED Results / Procedures / Treatments   Labs (all labs ordered are listed, but only abnormal results are displayed) Labs Reviewed  CBG MONITORING, ED - Abnormal; Notable for the following components:      Result Value   Glucose-Capillary 108 (*)    All other components within normal limits  URINE CULTURE  CBC  COMPREHENSIVE METABOLIC PANEL  URINALYSIS, ROUTINE W REFLEX MICROSCOPIC    EKG None  Radiology No results found.  Procedures Procedures    Medications Ordered in ED Medications  LORazepam (ATIVAN) injection 1 mg (has no administration in time range)    ED Course  I have reviewed the triage vital signs and the nursing notes.  Pertinent labs & imaging results that were available during my care of the patient were reviewed by me and considered in my  medical decision making (see chart for details).    MDM Rules/Calculators/A&P                          Urinalysis negative for infection here.  Labs are reassuring here.  Head CT negative.  Given Ativan and is much more calm and cooperative at this time.  Patient is sitting up in bed and eating a female without assistance.  Will recommend Ativan as needed as she only takes it now at night help her sleep.  Willing to follow-up with her doctor Final Clinical Impression(s) / ED Diagnoses Final diagnoses:  None    Rx / DC Orders ED Discharge Orders    None       Lorre Nick, MD 05/26/20 0938    Lorre Nick, MD 05/26/20 2056

## 2020-05-26 NOTE — ED Triage Notes (Signed)
Pt arrived via EMS from [facility]. Pt has had increased tremors for the past few days and that she has been acting paranoid. Pt's daughter states that she has weakness and cannot stand and pivot. Pt has hx of UTIs. Pt had a negative stroke screen per EMS. Pt is A&Ox2.

## 2020-05-26 NOTE — ED Triage Notes (Addendum)
Per EMS, pt from [facility]. Daughter reports she has increased tremors and was acting more scared than usual. Pt reports back pain and has been urinating more than normal for the past 3 weeks.   BP 186/90 HR 70 RR 18 SpO2 97 CBG 120

## 2020-05-28 LAB — URINE CULTURE: Culture: <10000 — AB

## 2021-02-12 ENCOUNTER — Encounter (HOSPITAL_COMMUNITY): Payer: Self-pay | Admitting: Internal Medicine

## 2021-02-12 ENCOUNTER — Other Ambulatory Visit: Payer: Self-pay

## 2021-02-12 ENCOUNTER — Inpatient Hospital Stay (HOSPITAL_COMMUNITY)
Admission: EM | Admit: 2021-02-12 | Discharge: 2021-03-11 | DRG: 871 | Disposition: E | Payer: Medicare Other | Attending: Internal Medicine | Admitting: Internal Medicine

## 2021-02-12 ENCOUNTER — Emergency Department (HOSPITAL_COMMUNITY): Payer: Medicare Other

## 2021-02-12 DIAGNOSIS — I4891 Unspecified atrial fibrillation: Secondary | ICD-10-CM | POA: Diagnosis not present

## 2021-02-12 DIAGNOSIS — Z515 Encounter for palliative care: Secondary | ICD-10-CM

## 2021-02-12 DIAGNOSIS — Z66 Do not resuscitate: Secondary | ICD-10-CM | POA: Diagnosis present

## 2021-02-12 DIAGNOSIS — R601 Generalized edema: Secondary | ICD-10-CM

## 2021-02-12 DIAGNOSIS — I480 Paroxysmal atrial fibrillation: Secondary | ICD-10-CM | POA: Diagnosis not present

## 2021-02-12 DIAGNOSIS — J189 Pneumonia, unspecified organism: Secondary | ICD-10-CM

## 2021-02-12 DIAGNOSIS — I447 Left bundle-branch block, unspecified: Secondary | ICD-10-CM | POA: Diagnosis present

## 2021-02-12 DIAGNOSIS — R1310 Dysphagia, unspecified: Secondary | ICD-10-CM | POA: Diagnosis present

## 2021-02-12 DIAGNOSIS — Z880 Allergy status to penicillin: Secondary | ICD-10-CM | POA: Diagnosis not present

## 2021-02-12 DIAGNOSIS — I10 Essential (primary) hypertension: Secondary | ICD-10-CM | POA: Diagnosis present

## 2021-02-12 DIAGNOSIS — Z950 Presence of cardiac pacemaker: Secondary | ICD-10-CM

## 2021-02-12 DIAGNOSIS — E039 Hypothyroidism, unspecified: Secondary | ICD-10-CM | POA: Diagnosis present

## 2021-02-12 DIAGNOSIS — F0390 Unspecified dementia without behavioral disturbance: Secondary | ICD-10-CM | POA: Diagnosis present

## 2021-02-12 DIAGNOSIS — Z79899 Other long term (current) drug therapy: Secondary | ICD-10-CM | POA: Diagnosis not present

## 2021-02-12 DIAGNOSIS — Z20822 Contact with and (suspected) exposure to covid-19: Secondary | ICD-10-CM | POA: Diagnosis present

## 2021-02-12 DIAGNOSIS — J690 Pneumonitis due to inhalation of food and vomit: Secondary | ICD-10-CM | POA: Diagnosis present

## 2021-02-12 DIAGNOSIS — Z79890 Hormone replacement therapy (postmenopausal): Secondary | ICD-10-CM

## 2021-02-12 DIAGNOSIS — J9601 Acute respiratory failure with hypoxia: Secondary | ICD-10-CM | POA: Diagnosis present

## 2021-02-12 DIAGNOSIS — Z7982 Long term (current) use of aspirin: Secondary | ICD-10-CM | POA: Diagnosis not present

## 2021-02-12 DIAGNOSIS — J449 Chronic obstructive pulmonary disease, unspecified: Secondary | ICD-10-CM | POA: Diagnosis present

## 2021-02-12 DIAGNOSIS — A419 Sepsis, unspecified organism: Principal | ICD-10-CM | POA: Diagnosis present

## 2021-02-12 DIAGNOSIS — I4819 Other persistent atrial fibrillation: Secondary | ICD-10-CM | POA: Diagnosis present

## 2021-02-12 HISTORY — DX: Dysphagia, unspecified: R13.10

## 2021-02-12 LAB — CBC WITH DIFFERENTIAL/PLATELET
Abs Immature Granulocytes: 0.07 10*3/uL (ref 0.00–0.07)
Basophils Absolute: 0 10*3/uL (ref 0.0–0.1)
Basophils Relative: 0 %
Eosinophils Absolute: 0 10*3/uL (ref 0.0–0.5)
Eosinophils Relative: 0 %
HCT: 37 % (ref 36.0–46.0)
Hemoglobin: 11.4 g/dL — ABNORMAL LOW (ref 12.0–15.0)
Immature Granulocytes: 1 %
Lymphocytes Relative: 7 %
Lymphs Abs: 0.5 10*3/uL — ABNORMAL LOW (ref 0.7–4.0)
MCH: 30.2 pg (ref 26.0–34.0)
MCHC: 30.8 g/dL (ref 30.0–36.0)
MCV: 97.9 fL (ref 80.0–100.0)
Monocytes Absolute: 0.4 10*3/uL (ref 0.1–1.0)
Monocytes Relative: 7 %
Neutro Abs: 5.1 10*3/uL (ref 1.7–7.7)
Neutrophils Relative %: 85 %
Platelets: 112 10*3/uL — ABNORMAL LOW (ref 150–400)
RBC: 3.78 MIL/uL — ABNORMAL LOW (ref 3.87–5.11)
RDW: 13.7 % (ref 11.5–15.5)
Smear Review: DECREASED
WBC: 6.1 10*3/uL (ref 4.0–10.5)
nRBC: 0 % (ref 0.0–0.2)

## 2021-02-12 LAB — I-STAT VENOUS BLOOD GAS, ED
Acid-Base Excess: 3 mmol/L — ABNORMAL HIGH (ref 0.0–2.0)
Bicarbonate: 27 mmol/L (ref 20.0–28.0)
Calcium, Ion: 1.15 mmol/L (ref 1.15–1.40)
HCT: 35 % — ABNORMAL LOW (ref 36.0–46.0)
Hemoglobin: 11.9 g/dL — ABNORMAL LOW (ref 12.0–15.0)
O2 Saturation: 86 %
Potassium: 4.6 mmol/L (ref 3.5–5.1)
Sodium: 140 mmol/L (ref 135–145)
TCO2: 28 mmol/L (ref 22–32)
pCO2, Ven: 39.3 mmHg — ABNORMAL LOW (ref 44.0–60.0)
pH, Ven: 7.444 — ABNORMAL HIGH (ref 7.250–7.430)
pO2, Ven: 50 mmHg — ABNORMAL HIGH (ref 32.0–45.0)

## 2021-02-12 LAB — URINALYSIS, ROUTINE W REFLEX MICROSCOPIC
Bilirubin Urine: NEGATIVE
Glucose, UA: NEGATIVE mg/dL
Ketones, ur: NEGATIVE mg/dL
Leukocytes,Ua: NEGATIVE
Nitrite: NEGATIVE
Protein, ur: NEGATIVE mg/dL
Specific Gravity, Urine: >1.03 — ABNORMAL HIGH (ref 1.005–1.030)
pH: 5.5 (ref 5.0–8.0)

## 2021-02-12 LAB — PROTIME-INR
INR: 1 (ref 0.8–1.2)
Prothrombin Time: 13.1 seconds (ref 11.4–15.2)

## 2021-02-12 LAB — COMPREHENSIVE METABOLIC PANEL
ALT: 28 U/L (ref 0–44)
AST: 22 U/L (ref 15–41)
Albumin: 3.4 g/dL — ABNORMAL LOW (ref 3.5–5.0)
Alkaline Phosphatase: 50 U/L (ref 38–126)
Anion gap: 9 (ref 5–15)
BUN: 31 mg/dL — ABNORMAL HIGH (ref 8–23)
CO2: 25 mmol/L (ref 22–32)
Calcium: 8.8 mg/dL — ABNORMAL LOW (ref 8.9–10.3)
Chloride: 107 mmol/L (ref 98–111)
Creatinine, Ser: 1.11 mg/dL — ABNORMAL HIGH (ref 0.44–1.00)
GFR, Estimated: 45 mL/min — ABNORMAL LOW (ref >60)
Glucose, Bld: 115 mg/dL — ABNORMAL HIGH (ref 70–99)
Potassium: 4.6 mmol/L (ref 3.5–5.1)
Sodium: 141 mmol/L (ref 135–145)
Total Bilirubin: 0.6 mg/dL (ref 0.3–1.2)
Total Protein: 5.9 g/dL — ABNORMAL LOW (ref 6.5–8.1)

## 2021-02-12 LAB — LACTIC ACID, PLASMA
Lactic Acid, Venous: 1.4 mmol/L (ref 0.5–1.9)
Lactic Acid, Venous: 1.5 mmol/L (ref 0.5–1.9)

## 2021-02-12 LAB — BRAIN NATRIURETIC PEPTIDE: B Natriuretic Peptide: 862.6 pg/mL — ABNORMAL HIGH (ref 0.0–100.0)

## 2021-02-12 LAB — RESP PANEL BY RT-PCR (FLU A&B, COVID) ARPGX2
Influenza A by PCR: NEGATIVE
Influenza B by PCR: NEGATIVE
SARS Coronavirus 2 by RT PCR: NEGATIVE

## 2021-02-12 LAB — PROCALCITONIN: Procalcitonin: <0.1 ng/mL

## 2021-02-12 LAB — URINALYSIS, MICROSCOPIC (REFLEX)

## 2021-02-12 LAB — APTT: aPTT: 21 seconds — ABNORMAL LOW (ref 24–36)

## 2021-02-12 MED ORDER — ONDANSETRON HCL 4 MG/2ML IJ SOLN: 4.0000 mg | Freq: Four times a day (QID) | INTRAMUSCULAR | Status: DC | PRN

## 2021-02-12 MED ORDER — AMIODARONE HCL IN DEXTROSE 360-4.14 MG/200ML-% IV SOLN
30.0000 mg/h | INTRAVENOUS | Status: DC
  Administered 2021-02-13: 30 mg/h via INTRAVENOUS
  Filled 2021-02-12 (×4): qty 200

## 2021-02-12 MED ORDER — LACTATED RINGERS IV BOLUS (SEPSIS)
500.0000 mL | Freq: Once | INTRAVENOUS | Status: AC
  Administered 2021-02-12: 500 mL via INTRAVENOUS

## 2021-02-12 MED ORDER — AMIODARONE HCL 200 MG PO TABS
100.0000 mg | ORAL_TABLET | Freq: Every morning | ORAL | Status: DC
  Administered 2021-02-12: 100 mg via ORAL
  Filled 2021-02-12: qty 1

## 2021-02-12 MED ORDER — ARIPIPRAZOLE 2 MG PO TABS
2.0000 mg | ORAL_TABLET | Freq: Every morning | ORAL | Status: DC
  Administered 2021-02-13 – 2021-02-14 (×2): 2 mg via ORAL
  Filled 2021-02-12 (×4): qty 1

## 2021-02-12 MED ORDER — ALBUTEROL SULFATE (2.5 MG/3ML) 0.083% IN NEBU
2.5000 mg | INHALATION_SOLUTION | RESPIRATORY_TRACT | Status: DC | PRN
  Administered 2021-02-12 – 2021-02-14 (×5): 2.5 mg via RESPIRATORY_TRACT
  Filled 2021-02-12 (×5): qty 3

## 2021-02-12 MED ORDER — HYDRALAZINE HCL 20 MG/ML IJ SOLN
5.0000 mg | INTRAMUSCULAR | Status: DC | PRN
  Administered 2021-02-13: 5 mg via INTRAVENOUS
  Filled 2021-02-12: qty 1

## 2021-02-12 MED ORDER — LEVOFLOXACIN IN D5W 750 MG/150ML IV SOLN
750.0000 mg | Freq: Once | INTRAVENOUS | Status: AC
  Administered 2021-02-12: 750 mg via INTRAVENOUS
  Filled 2021-02-12: qty 150

## 2021-02-12 MED ORDER — IPRATROPIUM-ALBUTEROL 0.5-2.5 (3) MG/3ML IN SOLN: 3.0000 mL | RESPIRATORY_TRACT | Status: DC | PRN

## 2021-02-12 MED ORDER — DOCUSATE SODIUM 100 MG PO CAPS
100.0000 mg | ORAL_CAPSULE | Freq: Two times a day (BID) | ORAL | Status: DC
  Administered 2021-02-13: 100 mg via ORAL
  Filled 2021-02-12 (×2): qty 1

## 2021-02-12 MED ORDER — MORPHINE SULFATE (PF) 2 MG/ML IV SOLN
2.0000 mg | INTRAVENOUS | Status: DC | PRN
  Administered 2021-02-12: 1 mg via INTRAVENOUS
  Administered 2021-02-13 – 2021-02-14 (×5): 2 mg via INTRAVENOUS
  Filled 2021-02-12 (×6): qty 1

## 2021-02-12 MED ORDER — ACETAMINOPHEN 325 MG PO TABS
650.0000 mg | ORAL_TABLET | Freq: Four times a day (QID) | ORAL | Status: DC | PRN
  Administered 2021-02-13 – 2021-02-14 (×3): 650 mg via ORAL
  Filled 2021-02-12 (×3): qty 2

## 2021-02-12 MED ORDER — GUAIFENESIN 100 MG/5ML PO LIQD: 10.0000 mL | ORAL | Status: DC | PRN

## 2021-02-12 MED ORDER — LACTATED RINGERS IV SOLN: INTRAVENOUS | Status: DC

## 2021-02-12 MED ORDER — METOPROLOL TARTRATE 5 MG/5ML IV SOLN
2.5000 mg | INTRAVENOUS | Status: AC | PRN
  Administered 2021-02-12 (×4): 2.5 mg via INTRAVENOUS
  Filled 2021-02-12 (×3): qty 5

## 2021-02-12 MED ORDER — LACTATED RINGERS IV BOLUS
500.0000 mL | Freq: Once | INTRAVENOUS | Status: AC
  Administered 2021-02-12: 500 mL via INTRAVENOUS

## 2021-02-12 MED ORDER — IPRATROPIUM-ALBUTEROL 0.5-2.5 (3) MG/3ML IN SOLN
RESPIRATORY_TRACT | Status: AC
  Filled 2021-02-12: qty 3

## 2021-02-12 MED ORDER — SODIUM CHLORIDE 0.9 % IV SOLN: INTRAVENOUS | Status: DC

## 2021-02-12 MED ORDER — SODIUM CHLORIDE 0.9 % IV SOLN
1.0000 g | Freq: Three times a day (TID) | INTRAVENOUS | Status: DC
  Administered 2021-02-12 – 2021-02-14 (×7): 1 g via INTRAVENOUS
  Filled 2021-02-12 (×12): qty 1

## 2021-02-12 MED ORDER — LEVOTHYROXINE SODIUM 50 MCG PO TABS
50.0000 ug | ORAL_TABLET | Freq: Every day | ORAL | Status: DC
  Administered 2021-02-14: 50 ug via ORAL
  Filled 2021-02-12 (×4): qty 1

## 2021-02-12 MED ORDER — SODIUM CHLORIDE 0.9 % IV SOLN
100.0000 mg | Freq: Two times a day (BID) | INTRAVENOUS | Status: DC
  Administered 2021-02-12 – 2021-02-14 (×5): 100 mg via INTRAVENOUS
  Filled 2021-02-12 (×5): qty 100

## 2021-02-12 MED ORDER — LORAZEPAM 1 MG PO TABS
1.0000 mg | ORAL_TABLET | Freq: Every day | ORAL | Status: DC
  Administered 2021-02-12: 1 mg via ORAL
  Filled 2021-02-12: qty 1

## 2021-02-12 MED ORDER — LACTATED RINGERS IV BOLUS: 500.0000 mL | Freq: Once | INTRAVENOUS | Status: DC

## 2021-02-12 MED ORDER — ASPIRIN EC 81 MG PO TBEC
81.0000 mg | DELAYED_RELEASE_TABLET | Freq: Every morning | ORAL | Status: DC
  Filled 2021-02-12: qty 1

## 2021-02-12 MED ORDER — ENOXAPARIN SODIUM 30 MG/0.3ML IJ SOSY
30.0000 mg | PREFILLED_SYRINGE | INTRAMUSCULAR | Status: DC
  Administered 2021-02-12 – 2021-02-14 (×3): 30 mg via SUBCUTANEOUS
  Filled 2021-02-12 (×3): qty 0.3

## 2021-02-12 MED ORDER — BISACODYL 5 MG PO TBEC: 5.0000 mg | DELAYED_RELEASE_TABLET | Freq: Every day | ORAL | Status: DC | PRN

## 2021-02-12 MED ORDER — IPRATROPIUM-ALBUTEROL 0.5-2.5 (3) MG/3ML IN SOLN
3.0000 mL | Freq: Four times a day (QID) | RESPIRATORY_TRACT | Status: DC
  Administered 2021-02-12 – 2021-02-13 (×4): 3 mL via RESPIRATORY_TRACT
  Filled 2021-02-12 (×6): qty 3

## 2021-02-12 MED ORDER — ACETAMINOPHEN 650 MG RE SUPP: 650.0000 mg | Freq: Four times a day (QID) | RECTAL | Status: DC | PRN

## 2021-02-12 MED ORDER — ONDANSETRON HCL 4 MG PO TABS: 4.0000 mg | ORAL_TABLET | Freq: Four times a day (QID) | ORAL | Status: DC | PRN

## 2021-02-12 MED ORDER — POLYETHYLENE GLYCOL 3350 17 G PO PACK: 17.0000 g | PACK | Freq: Every morning | ORAL | Status: DC

## 2021-02-12 MED ORDER — POLYVINYL ALCOHOL 1.4 % OP SOLN
1.0000 [drp] | Freq: Every morning | OPHTHALMIC | Status: DC
  Administered 2021-02-12 – 2021-02-16 (×3): 1 [drp] via OPHTHALMIC
  Filled 2021-02-12: qty 15

## 2021-02-12 MED ORDER — AMIODARONE HCL IN DEXTROSE 360-4.14 MG/200ML-% IV SOLN
60.0000 mg/h | INTRAVENOUS | Status: AC
  Administered 2021-02-12 – 2021-02-13 (×2): 60 mg/h via INTRAVENOUS
  Filled 2021-02-12 (×2): qty 200

## 2021-02-12 MED ORDER — MIRTAZAPINE 7.5 MG PO TABS
7.5000 mg | ORAL_TABLET | Freq: Every day | ORAL | Status: DC
  Administered 2021-02-12 – 2021-02-13 (×2): 7.5 mg via ORAL
  Filled 2021-02-12 (×3): qty 1

## 2021-02-12 MED ORDER — ACETAMINOPHEN 500 MG PO TABS
1000.0000 mg | ORAL_TABLET | Freq: Once | ORAL | Status: AC
  Administered 2021-02-12: 1000 mg via ORAL
  Filled 2021-02-12: qty 2

## 2021-02-12 MED ORDER — MELATONIN 3 MG PO TABS
6.0000 mg | ORAL_TABLET | Freq: Every day | ORAL | Status: DC
  Administered 2021-02-12 – 2021-02-13 (×2): 6 mg via ORAL
  Filled 2021-02-12 (×2): qty 2

## 2021-02-12 NOTE — Assessment & Plan Note (Addendum)
-  Chronic in nature -With dementia, at increased risk for aspiration -Imaging today concerning for aspiration -seen by SLP and started on Dysphagia 1 diet

## 2021-02-12 NOTE — ED Provider Notes (Signed)
[place] EMERGENCY DEPARTMENT Provider Note   CSN: [id number] Arrival date & time: 02/24/2021  0807     History  Chief Complaint  Patient presents with   Tachycardia    Tiffany Cowan is a 88 y.o. female.  88 year old woman presenting from memory care unit at [facility] with acute hypoxic respiratory failure.  Past medical history is significant for persistent A. fib, dementia, hypothyroidism, COPD, hypertension, GERD, prolonged QT, pacemaker, history of urinary retention, history of chronic back pain.  Per EMS report, patient had no complaints as of yesterday, was found this morning in respiratory distress, SpO2 to 89%. Patient has a cpap, but was unable to use it as she was not able to follow instructions to be able to take mask off. She was transported on NRB, >6L.  Patient is usually conversant at baseline however does have dementia, DNR signed.  Patient is awake and minimally verbally responsive.     Home Medications Prior to Admission medications   Medication Sig Start Date End Date Taking? Authorizing Provider  acetaminophen (TYLENOL) 325 MG tablet Take 650 mg by mouth every 6 (six) hours as needed (pain).   Yes [provider]  amiodarone (PACERONE) 200 MG tablet Take 100 mg by mouth every morning.   Yes [provider]  ARIPiprazole (ABILIFY) 2 MG tablet Take 2 mg by mouth in the morning. For dementia   Yes [provider]  aspirin EC 81 MG tablet Take 81 mg by mouth every morning. Swallow whole.   Yes [provider]  cholecalciferol (VITAMIN D3) 25 MCG (1000 UNIT) tablet Take 2,000 Units by mouth every morning.   Yes [provider]  docusate sodium (COLACE) 100 MG capsule Take 100 mg by mouth daily as needed (constipation).   Yes [provider]  guaiFENesin (ROBITUSSIN) 100 MG/5ML liquid Take 10 mLs by mouth every 4 (four) hours as needed for cough.   Yes [provider]  levothyroxine  (SYNTHROID) 50 MCG tablet Take 50 mcg by mouth daily at 6 (six) AM.   Yes [provider]  LORazepam (ATIVAN) 1 MG tablet Take 1 tablet (1 mg total) by mouth every 8 (eight) hours as needed for anxiety. Patient taking differently: Take 1 mg by mouth at bedtime. For anxiety 05/26/20  Yes Lacretia Leigh, MD  melatonin 3 MG TABS tablet Take 6 mg by mouth at bedtime.   Yes [provider]  metoprolol tartrate (LOPRESSOR) 25 MG tablet Take 25 mg by mouth 2 (two) times daily.   Yes [provider]  mirtazapine (REMERON) 7.5 MG tablet Take 7.5 mg by mouth at bedtime. For depression   Yes [provider]  Multiple Vitamin (MULTIVITAMIN WITH MINERALS) TABS tablet Take 1 tablet by mouth every morning.   Yes [provider]  Nutritional Supplements (NUTRITIONAL SUPPLEMENT PO) Take 1 each by mouth 3 (three) times daily after meals. Mighty shake   Yes [provider]  oxyCODONE (OXY IR/ROXICODONE) 5 MG immediate release tablet Take 5 mg by mouth every 12 (twelve) hours as needed for moderate pain.   Yes [provider]  polyethylene glycol (MIRALAX / GLYCOLAX) 17 g packet Take 17 g by mouth every morning.   Yes [provider]  polyvinyl alcohol (LIQUIFILM TEARS) 1.4 % ophthalmic solution Place 1 drop into both eyes every morning.   Yes [provider]  traMADol (ULTRAM) 50 MG tablet Take 50 mg by mouth 2 (two) times daily.   Yes [provider]      Allergies    Azithromycin, Penicillins, and Amoxicillin    Review of Systems   Review of Systems  Reason unable to perform ROS: unable to answer questions.  Constitutional:  Negative for activity change and fever.  HENT:  Negative for congestion.   Respiratory:  Negative for stridor.    Physical Exam Updated Vital Signs BP (!) 136/98    Pulse (!) 143    Temp (!) 101 F (38.3 C) (Rectal)    Resp 15    Ht 5\' 2"  (1.575 m)    Wt 56.7 kg Comment: last documented weight    SpO2 100%    BMI 22.86 kg/m  Physical Exam Vitals and nursing note reviewed.  Constitutional:      General: She is in acute distress.     Appearance: She is ill-appearing and toxic-appearing. She is not diaphoretic.     Comments: Ill-appearing, unable to answer questions, awake  HENT:     Head: Normocephalic and atraumatic.     Mouth/Throat:     Mouth: Mucous membranes are moist.  Eyes:     Conjunctiva/sclera: Conjunctivae normal.     Pupils: Pupils are equal, round, and reactive to light.  Cardiovascular:     Rate and Rhythm: Tachycardia present. Rhythm irregular.     Heart sounds: No murmur heard.   No friction rub. No gallop.  Pulmonary:     Effort: Respiratory distress present.     Breath sounds: No stridor. Rhonchi present. No wheezing.  Abdominal:     General: Abdomen is flat. Bowel sounds are normal.     Palpations: Abdomen is soft.  Musculoskeletal:        General: No swelling or tenderness.     Right lower leg: No edema.     Left lower leg: No edema.  Lymphadenopathy:     Cervical: No cervical adenopathy.  Skin:    Capillary Refill: Capillary refill takes 2 to 3 seconds.     Comments: Cool to touch  Neurological:     Mental Status: She is alert.     Comments: Patient unable to answer questions  Psychiatric:     Comments: Unalbe to answer questions    ED Results / Procedures / Treatments   Labs (all labs ordered are listed, but only abnormal results are displayed) Labs Reviewed  CBC WITH DIFFERENTIAL/PLATELET - Abnormal; Notable for the following components:      Result Value   RBC 3.78 (*)    Hemoglobin 11.4 (*)    Platelets 112 (*)    Lymphs Abs 0.5 (*)    All other components within normal limits  APTT - Abnormal; Notable for the following components:   aPTT 21 (*)    All other components within normal limits  URINALYSIS, ROUTINE W REFLEX MICROSCOPIC - Abnormal; Notable for the following components:   Specific Gravity, Urine >1.030 (*)    Hgb urine  dipstick TRACE (*)    All other components within normal limits  BRAIN NATRIURETIC PEPTIDE - Abnormal; Notable for the following components:   B Natriuretic Peptide 862.6 (*)    All other components within normal limits  COMPREHENSIVE METABOLIC PANEL - Abnormal; Notable for the following components:   Glucose, Bld 115 (*)    BUN 31 (*)    Creatinine, Ser 1.11 (*)    Calcium 8.8 (*)    Total Protein 5.9 (*)    Albumin 3.4 (*)    GFR, Estimated 45 (*)  All other components within normal limits  URINALYSIS, MICROSCOPIC (REFLEX) - Abnormal; Notable for the following components:   Bacteria, UA MANY (*)    All other components within normal limits  I-STAT VENOUS BLOOD GAS, ED - Abnormal; Notable for the following components:   pH, Ven 7.444 (*)    pCO2, Ven 39.3 (*)    pO2, Ven 50.0 (*)    Acid-Base Excess 3.0 (*)    HCT 35.0 (*)    Hemoglobin 11.9 (*)    All other components within normal limits  RESP PANEL BY RT-PCR (FLU A&B, COVID) ARPGX2  CULTURE, BLOOD (ROUTINE X 2)  CULTURE, BLOOD (ROUTINE X 2)  URINE CULTURE  LACTIC ACID, PLASMA  PROTIME-INR  LACTIC ACID, PLASMA    EKG EKG Interpretation  Date/Time:  Thursday February 12 2021 08:15:23 EST Ventricular Rate:  138 PR Interval:    QRS Duration: 134 QT Interval:  345 QTC Calculation: 523 R Axis:   -68 Text Interpretation: Atrial fibrillation Ventricular bigeminy Left bundle branch block No significant change since last tracing Confirmed by Deno Etienne [phone]) on 02/21/2021 8:19:46 AM  Radiology DG Chest Port 1 View  Result Date: 03/07/2021 CLINICAL DATA:  88 year old female with possible sepsis. EXAM: PORTABLE CHEST 1 VIEW COMPARISON:  Portable chest 11/14/2019. FINDINGS: Portable AP semi upright view at 0841 hours. Stable left chest dual lead cardiac pacemaker. Stable mild cardiomegaly, Calcified aortic atherosclerosis. Lower lung volumes. Patchy and confluent new bilateral perihilar opacity, upper lobe on the right and  mid lung on the left. Vague similar left lung base opacity. No pneumothorax or pleural effusion. Background pulmonary vascularity appears to remain normal. No acute osseous abnormality identified. Paucity of bowel gas in the upper abdomen. IMPRESSION: 1. Multifocal bilateral patchy lung opacity suspicious for Bilateral Pneumonia. Aspiration also a consideration. No pleural effusion is evident. 2. Mild cardiomegaly.  Aortic Atherosclerosis (ICD10-I70.0). Electronically Signed   By: Genevie Ann M.D.   On: 02/11/2021 08:52    Procedures Procedures    Medications Ordered in ED Medications  lactated ringers infusion ( Intravenous New Bag/Given 03/04/2021 0905)  ipratropium-albuterol (DUONEB) 0.5-2.5 (3) MG/3ML nebulizer solution 3 mL (has no administration in time range)  acetaminophen (TYLENOL) tablet 1,000 mg (has no administration in time range)  ipratropium-albuterol (DUONEB) 0.5-2.5 (3) MG/3ML nebulizer solution (  Given 03/08/2021 0835)  lactated ringers bolus 500 mL (0 mLs Intravenous Stopped 02/15/2021 0940)  levofloxacin (LEVAQUIN) IVPB 750 mg (750 mg Intravenous New Bag/Given 02/19/2021 0905)    ED Course/ Medical Decision Making/ A&P                           Medical Decision Making 88 year old woman presenting possible sepsis due to community acquired pneumonia.  She has improved in mentation with high flow nasal cannula, SpO2 100%. Patient is meeting sepsis criteria with fever to 101.1*F, tachycardia (a fib with RVR), source with multifocal pneumonia. Lactate reassuring 1.4, cr improved 1.1 from 1.35 6 months ago, AG WNL, hgb stable at 11, UA reassuringly normal. Treated with levaquin due to anaphylaxis to PCN. Reviewed EKG manually, a. Fib with rvr, however true QT c likely not present. Patient does not appear dry, 500cc LR bolus given. Discussed with [facility], who will admit patient to their service.  Amount and/or Complexity of Data Reviewed Independent Historian: EMS External Data Reviewed: notes. Labs:  ordered. Decision-making details documented in ED Course. Radiology: ordered. Decision-making details documented in ED Course. ECG/medicine tests: ordered. Decision-making  details documented in ED Course.  Risk Prescription drug management. Decision regarding hospitalization.         Final Clinical Impression(s) / ED Diagnoses Final diagnoses:  Community acquired pneumonia of both lungs  Atrial fibrillation with RVR [facility])    Rx / DC Orders ED Discharge Orders     None         Gladys Damme, MD 02/14/2021 [place], DO 02/18/2021 1043

## 2021-02-12 NOTE — Assessment & Plan Note (Addendum)
-  Fairly advanced dementia at baseline

## 2021-02-12 NOTE — ED Triage Notes (Signed)
Pt arrived from [facility] via [facility] with c/c of A-Fib/ Per EMS pt was found to be blue in face and in A-Fib 140s. Pt arrived on NRB any responsive to pain.   98-150sHR, 160/102,  1 nitro

## 2021-02-12 NOTE — Progress Notes (Signed)
HR 140s. RR 30s. Notified MD. Ordered to give 1 mg of morphine. MD at bedside.

## 2021-02-12 NOTE — ED Notes (Signed)
Attempted to give report to Steffanie Dunn, RN of [phone]

## 2021-02-12 NOTE — Assessment & Plan Note (Signed)
Noted  

## 2021-02-12 NOTE — ED Notes (Signed)
Got patient undressed on the monitor did ekg shown to Dr Adela Lank patient is resting with nurse at bedside and call bell in reach

## 2021-02-12 NOTE — Progress Notes (Signed)
HR 140s-150s. PRN metoprolol given.  Sabra Heck, RN

## 2021-02-12 NOTE — Progress Notes (Signed)
Pharmacy Antibiotic Note  Tiffany Cowan is a 88 y.o. female admitted on 03/04/2021 presenting with tachycardia, CXR with multifocal pna.  Pharmacy has been consulted for Aztreonam dosing.    Plan: Aztreonam 1g IV q 8h Monitor renal function, clinical progression and LOT  Height: 5\' 2"  (157.5 cm) Weight: 56.7 kg (125 lb) (last documented weight) IBW/kg (Calculated) : 50.1  Temp (24hrs), Avg:99.6 F (37.6 C), Min:98.2 F (36.8 C), Max:101 F (38.3 C)  Recent Labs  Lab 02/23/2021 0844 03/09/2021 0845 03/08/2021 1022  WBC 6.1  --   --   CREATININE 1.11*  --   --   LATICACIDVEN  --  1.4 1.5    Estimated Creatinine Clearance: 23.4 mL/min (A) (by C-G formula based on SCr of 1.11 mg/dL (H)).    Allergies  Allergen Reactions   Azithromycin Itching and Nausea Only   Penicillins Anaphylaxis   Amoxicillin Other (See Comments)    Listed on [facility] 03/05/2021    04/12/21, PharmD Clinical Pharmacist ED Pharmacist Phone # [phone] 02/19/2021 1:19 PM

## 2021-02-12 NOTE — H&P (Signed)
History and Physical    Patient: Tiffany Cowan [id number] DOB: 12-04-1923 DOA: 03/02/2021 DOS: the patient was seen and examined on 02/21/2021 PCP: [facility]  [facility]; NOK: Daughter, Tiffany Cowan, [phone]   Chief Complaint: Tachycardia, respiratory distress  HPI: Tiffany Cowan is a 88 y.o. female with medical history significant of afib; dementia; HTN; and pacemaker placement presenting with tachycardia.  She was at her facility this AM and was found to be in respiratory distress.  The facility found her to be afib with RVR and called 911.  In the ER, the patient is alert but really unable to respond to questions; she continuously asked for her teeth and reported desire to eat.  Her granddaughter-in-law was present but unable to provide additional history.    ER Course:  Found in respiratory distress, unable to answer questions.  Placed on NRB -> HFNC (unable to follow commands so no BIPAP).  Mental status has improved.  Fever, rhonchi, CXR with multifocal PNA.  Given Levaquin.  DNR at bedside.     Review of Systems: unable to review all systems due to the inability of the patient to answer questions. Past Medical History:  Diagnosis Date   A-fib (HCC)    Dementia (HCC)    Dysphagia    Hypertension    Pacemaker    No past surgical history on file. Social History:  reports that she has never smoked. She has never used smokeless tobacco. She reports that she does not currently use alcohol. She reports that she does not use drugs.  Allergies  Allergen Reactions   Azithromycin Itching and Nausea Only   Penicillins Anaphylaxis   Amoxicillin Other (See Comments)    [facility] 03/09/2021    No family history on file.  Prior to Admission medications   Medication Sig Start Date End Date Taking? Authorizing Provider  acetaminophen (TYLENOL) 325 MG tablet Take 650 mg by mouth every 6 (six) hours as needed (pain).    Yes [provider]  amiodarone (PACERONE) 200 MG tablet Take 100 mg by mouth every morning.   Yes [provider]  ARIPiprazole (ABILIFY) 2 MG tablet Take 2 mg by mouth in the morning. For dementia   Yes [provider]  aspirin EC 81 MG tablet Take 81 mg by mouth every morning. Swallow whole.   Yes [provider]  cholecalciferol (VITAMIN D3) 25 MCG (1000 UNIT) tablet Take 2,000 Units by mouth every morning.   Yes [provider]  docusate sodium (COLACE) 100 MG capsule Take 100 mg by mouth daily as needed (constipation).   Yes [provider]  guaiFENesin (ROBITUSSIN) 100 MG/5ML liquid Take 10 mLs by mouth every 4 (four) hours as needed for cough.   Yes [provider]  levothyroxine (SYNTHROID) 50 MCG tablet Take 50 mcg by mouth daily at 6 (six) AM.   Yes [provider]  LORazepam (ATIVAN) 1 MG tablet Take 1 tablet (1 mg total) by mouth every 8 (eight) hours as needed for anxiety. Patient taking differently: Take 1 mg by mouth at bedtime. For anxiety 05/26/20  Yes Lorre NickAllen, Anthony, MD  melatonin 3 MG TABS tablet Take 6 mg by mouth at bedtime.   Yes [provider]  metoprolol tartrate (LOPRESSOR) 25 MG tablet Take 25 mg by mouth 2 (two) times daily.   Yes [provider]  mirtazapine (REMERON) 7.5 MG tablet Take 7.5 mg by mouth at bedtime. For depression  Yes [provider]  Multiple Vitamin (MULTIVITAMIN WITH MINERALS) TABS tablet Take 1 tablet by mouth every morning.   Yes [provider]  Nutritional Supplements (NUTRITIONAL SUPPLEMENT PO) Take 1 each by mouth 3 (three) times daily after meals. Mighty shake   Yes [provider]  oxyCODONE (OXY IR/ROXICODONE) 5 MG immediate release tablet Take 5 mg by mouth every 12 (twelve) hours as needed for moderate pain.   Yes [provider]  polyethylene glycol (MIRALAX / GLYCOLAX) 17 g packet Take 17 g by mouth every morning.    Yes [provider]  polyvinyl alcohol (LIQUIFILM TEARS) 1.4 % ophthalmic solution Place 1 drop into both eyes every morning.   Yes [provider]  traMADol (ULTRAM) 50 MG tablet Take 50 mg by mouth 2 (two) times daily.   Yes [provider]    Physical Exam: Vitals:   2021/02/21 1422 2021/02/21 1424 02/21/21 1500 Feb 21, 2021 1600  BP: 123/85 123/85 (!) 127/100 (!) 124/108  Pulse: (!) 134 (!) 123 (!) 141 61  Resp: 20 20 (!) 25 (!) 28  Temp: (!) 97.5 F (36.4 C)  98 F (36.7 C) 98.5 F (36.9 C)  TempSrc: Oral  Oral Oral  SpO2: 93% 96% 90% 97%  Weight:      Height:       General:  Appears calm and comfortable and is in NAD Eyes:   normal lids, iris ENT:  grossly normal lips & tongue, mmm; absent upper dentition Neck:  no LAD, masses or thyromegaly Cardiovascular:  RR with tachycardia, no m/r/g. No LE edema.  Respiratory:   CTA bilaterally with no wheezes/rales/rhonchi.  Normal respiratory effort. Abdomen:  soft, NT, ND Skin:  no rash or induration seen on limited exam Musculoskeletal:  grossly normal tone BUE/BLE, good ROM, no bony abnormality Psychiatric:  blunted mood and affect, speech somewhat fluent but inappropriate, AOx1 Neurologic:  unable to effectively perform   Radiological Exams on Admission: Independently reviewed - see discussion in A/P where applicable  DG Chest Port 1 View  Result Date: 02-21-2021 CLINICAL DATA:  88 year old female with possible sepsis. EXAM: PORTABLE CHEST 1 VIEW COMPARISON:  Portable chest 11/14/2019. FINDINGS: Portable AP semi upright view at 0841 hours. Stable left chest dual lead cardiac pacemaker. Stable mild cardiomegaly, Calcified aortic atherosclerosis. Lower lung volumes. Patchy and confluent new bilateral perihilar opacity, upper lobe on the right and mid lung on the left. Vague similar left lung base opacity. No pneumothorax or pleural effusion. Background pulmonary vascularity appears to remain normal. No acute  osseous abnormality identified. Paucity of bowel gas in the upper abdomen. IMPRESSION: 1. Multifocal bilateral patchy lung opacity suspicious for Bilateral Pneumonia. Aspiration also a consideration. No pleural effusion is evident. 2. Mild cardiomegaly.  Aortic Atherosclerosis (ICD10-I70.0). Electronically Signed   By: Odessa Fleming M.D.   On: Feb 21, 2021 08:52    EKG: Independently reviewed.  Afib with rate 138; LBBB with NSCSLT   Labs on Admission: I have personally reviewed the available labs and imaging studies at the time of the admission.  Pertinent labs:    VBG: 7.444/39.3/50/27 BUN 31/Creatinine 1.11/GFR 45 BNP 862.6 Lactate 1.4 WBC 6.1 Hgb 11.4 Platelets 112 UA: trace Hgb, many bacteria    Assessment and Plan: * Sepsis due to pneumonia (HCC)- (present on admission) -Sepsis indicates life-threatening organ dysfunction with mortality >10%, caused by dysregulation to host response.   -SIRS criteria in this patient includes: Fever, tachycardia, tachypnea, hypoxia  -Patient has evidence of acute organ failure  recurrent hypotension (SBP < 90 or MAP < 65 x 2 readings) that is not easily explained by another condition. -While awaiting blood cultures, this appears to be a preseptic condition. -Sepsis protocol initiated -Patient had initial lactate >4 or SBP <90/MAP <65 and so has received the 30 cc/kg IVF bolus. -Suspected source is PNA, possibly aspiration -Imaging is c/w aspiration PNA and she has known h/o dysphagia -For now, NPO and will need swallow evaluation prior to diet -If he has dyscoordinated swallow associated with dementia, there may need to be discussion regarding transition to comfort care -Will admit to progressive care -Will cover with Aztreonam and doxy due to anaphylactic PCN allergy -Standing Duonebs, prn Albuterol -prn Robitussin  DNR (do not resuscitate)- (present on admission) -I have discussed code status with the patient and her granddaughter and  they are in  agreement that the patient would not desire resuscitation and would prefer to die a natural death should that situation arise. -She has a gold out of facility DNR form at the bedside  Pacemaker- (present on admission) -Noted  A-fib (HCC)- (present on admission) -Rate controlled with Amiodarone, continue -Also on lopressor but initial BPs were too low to order -PRN IV Lopressor ordered instead -Also with pacer -Not on [facility] but is taking ASA 81 mg daily  Dementia (HCC)- (present on admission) -Fairly advanced dementia at baseline -Continue home meds - Remeron, Ativan, Abilify -Will order delirium precautions -NOK reports no issues with wandering or agitation  Dysphagia -Chronic in nature -With dementia, at increased risk for aspiration -Imaging today concerning for aspiration -Will keep NPO pending speech therapy swallow evaluation -If unable to effectively swallow, will need to discuss transition to comfort  Hypertension- (present on admission) -BP has improved so likely ok to resume Lopressor at bedtime -prn  IV hydralazine also added       Advance Care Planning:   Code Status: DNR   Consults: ST  DVT Prophylaxis: Lovenox  Family Communication: Granddaughter was present throughout evaluation  Severity of Illness: The appropriate patient status for this patient is INPATIENT. Inpatient status is judged to be reasonable and necessary in order to provide the required intensity of service to ensure the patient's safety. The patient's presenting symptoms, physical exam findings, and initial radiographic and laboratory data in the context of their chronic comorbidities is felt to place them at high risk for further clinical deterioration. Furthermore, it is not anticipated that the patient will be medically stable for discharge from the hospital within 2 midnights of admission.   * I certify that at the point of admission it is my clinical judgment that the patient will require  inpatient hospital care spanning beyond 2 midnights from the point of admission due to high intensity of service, high risk for further deterioration and high frequency of surveillance required.*  Author: Jonah Blue, MD 03/01/2021 5:50 PM  For on call review [email or web address].

## 2021-02-12 NOTE — Assessment & Plan Note (Addendum)
-  BP remains soft, holding home dose of metoprolol

## 2021-02-12 NOTE — Assessment & Plan Note (Addendum)
-  currently has RVR, likely precipitated by sepsis -holding home metoprolol due to soft blood pressures -she is on chronic amiodarone, this was changed to IV infusion -Also with pacer -Not on [facility] but is taking ASA 81 mg daily -Due to goals of care, amiodarone infusion has been discontinued

## 2021-02-12 NOTE — Significant Event (Signed)
Patient's heart rate remained persistently in the 140s with A. fib with RVR with systolic blood pressure around 96.  Patient was already given full dose of IV metoprolol 5 mg.  Given the low normal blood pressures and also patient taking amiodarone at home discussed with pharmacy and at this time plan is to start amiodarone infusion for rate control.  Midge Minium

## 2021-02-12 NOTE — Assessment & Plan Note (Addendum)
Goals of care readdressed with patient's daughter She reaffirmed that patient is DNR Due to lack of meaningful improvements, patient's persistent tachypnea and appearance of discomfort, her daughter feels it is reasonable to transition towards comfort measures She was started on morphine infusion for respiratory distress Once being started on morphine, it was noted that she had significant improvement in respiratory rate and appears to be comfortable She eventually succumbed to her illness and passed away in the hospital

## 2021-02-12 NOTE — ED Notes (Signed)
Made Charge RN aware of patients BP

## 2021-02-12 NOTE — Progress Notes (Signed)
RT by to assess patient. HR 130's, RR 30's, sats 96% on 4 Lpm [place]. Patient with bilateral coarse crackles and expiratory wheezing with increase WOB. RN at bedside. MD notified. No new RT orders given at this time.

## 2021-02-12 NOTE — Assessment & Plan Note (Addendum)
-  Sepsis indicates life-threatening organ dysfunction with mortality >10%, caused by dysregulation to host response.   -SIRS criteria in this patient includes: Fever, tachycardia, tachypnea, hypoxia  -Patient has evidence of acute organ failure recurrent hypotension (SBP < 90 or MAP < 65 x 2 readings) that is not easily explained by another condition. -Blood cultures 1 out of 2 positive for staph epidermidis, suspect contaminant -urine culture positive for klebsiella and lactobacillus. -Sepsis protocol initiated -Patient had initial SBP <90/MAP <65 and so has received the 30 cc/kg IVF bolus. -Suspected source is PNA, possibly aspiration -Imaging is c/w aspiration PNA and she has known h/o dysphagia -Seen by speech therapy and started on dysphagia 1 diet -On Iv antibiotics with aztreonam and doxycycline -Standing Duonebs, prn Albuterol -prn Robitussin -Unfortunately, she has not made any meaningful improvements

## 2021-02-13 DIAGNOSIS — A419 Sepsis, unspecified organism: Secondary | ICD-10-CM | POA: Diagnosis not present

## 2021-02-13 DIAGNOSIS — I480 Paroxysmal atrial fibrillation: Secondary | ICD-10-CM | POA: Diagnosis not present

## 2021-02-13 DIAGNOSIS — J189 Pneumonia, unspecified organism: Secondary | ICD-10-CM | POA: Diagnosis not present

## 2021-02-13 DIAGNOSIS — R601 Generalized edema: Secondary | ICD-10-CM

## 2021-02-13 DIAGNOSIS — E039 Hypothyroidism, unspecified: Secondary | ICD-10-CM

## 2021-02-13 DIAGNOSIS — J9601 Acute respiratory failure with hypoxia: Secondary | ICD-10-CM

## 2021-02-13 LAB — BLOOD CULTURE ID PANEL (REFLEXED) - BCID2

## 2021-02-13 LAB — CBC
HCT: 32.7 % — ABNORMAL LOW (ref 36.0–46.0)
Hemoglobin: 10.5 g/dL — ABNORMAL LOW (ref 12.0–15.0)
MCH: 31 pg (ref 26.0–34.0)
MCHC: 32.1 g/dL (ref 30.0–36.0)
MCV: 96.5 fL (ref 80.0–100.0)
Platelets: 182 10*3/uL (ref 150–400)
RBC: 3.39 MIL/uL — ABNORMAL LOW (ref 3.87–5.11)
RDW: 14.1 % (ref 11.5–15.5)
WBC: 7.7 10*3/uL (ref 4.0–10.5)
nRBC: 0 % (ref 0.0–0.2)

## 2021-02-13 LAB — BASIC METABOLIC PANEL
Anion gap: 13 (ref 5–15)
BUN: 25 mg/dL — ABNORMAL HIGH (ref 8–23)
CO2: 20 mmol/L — ABNORMAL LOW (ref 22–32)
Calcium: 8.3 mg/dL — ABNORMAL LOW (ref 8.9–10.3)
Chloride: 107 mmol/L (ref 98–111)
Creatinine, Ser: 1.05 mg/dL — ABNORMAL HIGH (ref 0.44–1.00)
GFR, Estimated: 49 mL/min — ABNORMAL LOW (ref >60)
Glucose, Bld: 125 mg/dL — ABNORMAL HIGH (ref 70–99)
Potassium: 4.5 mmol/L (ref 3.5–5.1)
Sodium: 140 mmol/L (ref 135–145)

## 2021-02-13 LAB — STREP PNEUMONIAE URINARY ANTIGEN: Strep Pneumo Urinary Antigen: NEGATIVE

## 2021-02-13 MED ORDER — LORAZEPAM 2 MG/ML IJ SOLN
0.5000 mg | Freq: Four times a day (QID) | INTRAMUSCULAR | Status: DC | PRN
  Administered 2021-02-14: 0.5 mg via INTRAVENOUS
  Filled 2021-02-13: qty 1

## 2021-02-13 NOTE — Progress Notes (Signed)
PHARMACY - PHYSICIAN COMMUNICATION CRITICAL VALUE ALERT - BLOOD CULTURE IDENTIFICATION (BCID)  Tiffany Cowan is an 88 y.o. female who presented to [facility] on 13-Mar-2021 with a chief complaint of tachycardia and respiratory distress  Assessment:  89 YOF started on antibiotics for concern for PNA and now with 1 of 4 blood cultures growing GPC with BCID detecting MRSE. This could be a contaminant but it is noted the patient does have a pacemaker so would need to rule out infection there.   Name of physician (or Provider) Contacted: Memon  Current antibiotics: Azactam + Doxycycline  Changes to prescribed antibiotics recommended:  No changes for now - suspected contaminant. No escalation of care for now per discussion with MD.   Results for orders placed or performed during the hospital encounter of Mar 13, 2021  Blood Culture ID Panel (Reflexed) (Collected: 03-13-21  8:22 AM)  Result Value Ref Range   Enterococcus faecalis NOT DETECTED NOT DETECTED   Enterococcus Faecium NOT DETECTED NOT DETECTED   Listeria monocytogenes NOT DETECTED NOT DETECTED   Staphylococcus species DETECTED (A) NOT DETECTED   Staphylococcus aureus (BCID) NOT DETECTED NOT DETECTED   Staphylococcus epidermidis DETECTED (A) NOT DETECTED   Staphylococcus lugdunensis NOT DETECTED NOT DETECTED   Streptococcus species NOT DETECTED NOT DETECTED   Streptococcus agalactiae NOT DETECTED NOT DETECTED   Streptococcus pneumoniae NOT DETECTED NOT DETECTED   Streptococcus pyogenes NOT DETECTED NOT DETECTED   A.calcoaceticus-baumannii NOT DETECTED NOT DETECTED   Bacteroides fragilis NOT DETECTED NOT DETECTED   Enterobacterales NOT DETECTED NOT DETECTED   Enterobacter cloacae complex NOT DETECTED NOT DETECTED   Escherichia coli NOT DETECTED NOT DETECTED   Klebsiella aerogenes NOT DETECTED NOT DETECTED   Klebsiella oxytoca NOT DETECTED NOT DETECTED   Klebsiella pneumoniae NOT DETECTED NOT DETECTED   Proteus species NOT DETECTED NOT  DETECTED   Salmonella species NOT DETECTED NOT DETECTED   Serratia marcescens NOT DETECTED NOT DETECTED   Haemophilus influenzae NOT DETECTED NOT DETECTED   Neisseria meningitidis NOT DETECTED NOT DETECTED   Pseudomonas aeruginosa NOT DETECTED NOT DETECTED   Stenotrophomonas maltophilia NOT DETECTED NOT DETECTED   Candida albicans NOT DETECTED NOT DETECTED   Candida auris NOT DETECTED NOT DETECTED   Candida glabrata NOT DETECTED NOT DETECTED   Candida krusei NOT DETECTED NOT DETECTED   Candida parapsilosis NOT DETECTED NOT DETECTED   Candida tropicalis NOT DETECTED NOT DETECTED   Cryptococcus neoformans/gattii NOT DETECTED NOT DETECTED   Methicillin resistance mecA/C DETECTED (A) NOT DETECTED    Thank you for allowing pharmacy to be a part of this patients care.  Georgina Pillion, PharmD, BCPS 02/13/2021 4:08 PM   **Pharmacist phone directory can now be found on [email or web address] ([email or web address]).  Listed under [facility].

## 2021-02-13 NOTE — Assessment & Plan Note (Addendum)
Currently on 4L of oxygen with increased respiratory effort and tachypnea Will try and wean down oxygen as tolerated Secondary to pneumonia Continue bronchodilator treatment She is now on morphine infusion which will be titrated for respiratory distress

## 2021-02-13 NOTE — Hospital Course (Addendum)
88 y/o female who is a resident of ALF, admitted with shortness of breath. Found to have sepsis related to pneumonia. Suspect aspiration pneumonia with history of dysphagia. Started on fluids and antibiotics. Also noted to be in rapid atrial fibrillation.  She was started on amiodarone infusion. She is DNR.  After 24 to 48 hours of treatment, it was noted that patient was not making meaningful improvements.  Family wished to focus on quality of life and comfort.  She was transitioned to comfort measures.  Started on morphine infusion for respiratory distress.  Patient subsequently passed away in the hospital

## 2021-02-13 NOTE — Assessment & Plan Note (Addendum)
Synthroid discontinued based on goals of care

## 2021-02-13 NOTE — Progress Notes (Signed)
Progress Note   Patient: Tiffany Cowan [id number] DOB: [birth year] DOA: 03/06/2021     1 DOS: the patient was seen and examined on 02/13/2021   Brief hospital course: 88 y/o female who is a resident of ALF, admitted with shortness of breath. Found to have sepsis related to pneumonia. Suspect aspiration pneumonia with history of dysphagia. Started on fluids and antibiotics. Also noted to be in rapid atrial fibrillation. Currently on amiodarone infusion. She is DNR. Family wish to continue current treatments for now, but are agreeable to symptom management with morphine for tachypnea. If patient fails to make meaningful improvements, they would consider transitioning to full comfort care.  Assessment and Plan: * Sepsis due to pneumonia [facility])- (present on admission) -Sepsis indicates life-threatening organ dysfunction with mortality >10%, caused by dysregulation to host response.   -SIRS criteria in this patient includes: Fever, tachycardia, tachypnea, hypoxia  -Patient has evidence of acute organ failure recurrent hypotension (SBP < 90 or MAP < 65 x 2 readings) that is not easily explained by another condition. -Blood cultures 1 out of 2 positive for staph epidermidis, suspect contaminant -urine culture positive for klebsiella and lactobacillus. -Sepsis protocol initiated -Patient had initial SBP <90/MAP <65 and so has received the 30 cc/kg IVF bolus. -Suspected source is PNA, possibly aspiration -Imaging is c/w aspiration PNA and she has known h/o dysphagia -Seen by speech therapy and started on dysphagia 1 diet -On Iv antibiotics with aztreonam and doxycycline -Standing Duonebs, prn Albuterol -prn Robitussin  Anasarca Developing significant swelling in extremities Suspect this may be causing some pulmonary edema as well BNP elevated on admission Holding further IV fluids for now Would avoid diuretics for now with borderline blood pressures  Hypothyroidism Continue synthroid  Acute  respiratory failure with hypoxia (HCC) Currently on 4L of oxygen with increased respiratory effort and tachypnea Will try and wean down oxygen as tolerated Secondary to pneumonia Continue bronchodilator treatment Due to increased respiratory effort, daughter agrees with using morphine prn for tachypnea   DNR (do not resuscitate)- (present on admission) -I have discussed code status with the patient and her granddaughter and  they are in agreement that the patient would not desire resuscitation and would prefer to die a natural death should that situation arise. -She has a gold out of facility DNR form at the bedside  Pacemaker- (present on admission) -Noted  A-fib ([place])- (present on admission) -currently has RVR, likely precipitated by sepsis -holding home metoprolol due to soft blood pressures -she is on chronic amiodarone, this was changed to IV infusion -Also with pacer -Not on [facility] but is taking ASA 81 mg daily  Dementia (HCC)- (present on admission) -Fairly advanced dementia at baseline -Continue home meds - Remeron, Ativan, Abilify -Will order delirium precautions -NOK reports no issues with wandering or agitation  Dysphagia -Chronic in nature -With dementia, at increased risk for aspiration -Imaging today concerning for aspiration -seen by SLP and started on Dysphagia 1 diet -If unable to effectively swallow, will need to discuss transition to comfort  Hypertension- (present on admission) -BP remains soft, holding home dose of metoprolol        Subjective: confused, does not realize she is in the hospital  Physical Exam: Vitals:   02/13/21 0500 02/13/21 0600 02/13/21 0700 02/13/21 0800  BP: 94/82 113/68 (!) 86/51 99/70  Pulse: (!) 146 (!) 46 (!) 111 (!) 131  Resp: (!) 30 (!) 29 (!) 26 (!) 30  Temp:    100.1 F (37.8 C)  TempSrc:  Oral  SpO2: 96% 95% 97% 94%  Weight:      Height:       General exam: Alert, awake  Respiratory system: bilateral rhonchi,  increased respiratory effort Cardiovascular system:irregular. No murmurs, rubs, gallops. Gastrointestinal system: Abdomen is nondistended, soft and nontender. No organomegaly or masses felt. Normal bowel sounds heard. Central nervous system: No focal neurological deficits. Extremities: 1-2+ edema bilaterally, anasarca Skin: No rashes, lesions or ulcers Psychiatry: confused    Data Reviewed:  Reviewed imaging of chest, cbc and chemistry. Ordered repeat labs including chemistry and cbc for AM  Family Communication: updated patient's daughter over the phone  Disposition: Status is: Inpatient Remains inpatient appropriate because: continued management of sepsis, respiratory failure and rapid a fib          Planned Discharge Destination: Skilled nursing facility     Time spent: 45 minutes  Author: Kathie Dike, MD 02/13/2021 1:53 PM  For on call review [email or web address].

## 2021-02-13 NOTE — Assessment & Plan Note (Addendum)
Developing significant swelling in extremities Suspect this may be causing some pulmonary edema as well BNP elevated on admission Holding further IV fluids for now Will give 1 dose of Lasix

## 2021-02-13 NOTE — Evaluation (Signed)
Clinical/Bedside Swallow Evaluation Patient Details  Name: Tiffany Cowan MRN: [id number] Date of Birth: 26-Dec-1923  Today's Date: 02/13/2021 Time: SLP Start Time (ACUTE ONLY): 1115 SLP Stop Time (ACUTE ONLY): 1145 SLP Time Calculation (min) (ACUTE ONLY): 30 min  Past Medical History:  Past Medical History:  Diagnosis Date   A-fib (HCC)    Dementia (HCC)    Dysphagia    Hypertension    Pacemaker    Past Surgical History: No past surgical history on file. HPI:  88 y.o. female presented from facility via EMS with RVR. Dx sepsis due to pna. PMH significant for afib; dementia; known dysphagia; HTN; and pacemaker placement. According to her daughter at bedside, pt moved from [place] two years ago. 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    Assessment / Plan / Recommendation  Clinical Impression  Pt presents with a functional swallow, but she is at risk for aspiration and its adverse consequences.  Her RR fluctuated between 30-40 during assessment - this rate can create dysynchony of the swallow/respiratory sequence, creating opportunity for aspiration.  Ms. Grime allowed several sips of water and a few bites of applesauce.  There were no overt s/sx of aspiration.  She was attentive to POs with good oral seal, no residue post-swallow, palpable swallow.  Spoke with her dtr at bedside regarding the concern for aspiration and its consequences.  We discussed her hx of stricture; her current frailty; her current RR and condition potentially exacerbating her swallowing difficulties. Ms. Lovena Le verbalized understanding - we agreed to try purees/thin liquids with caution; small sips; crush meds.  Hold tray when there is overt coughing. SLP will follow for education and diet advancement as indicated. SLP Visit Diagnosis: Dysphagia, unspecified (R13.10)    Aspiration Risk  Moderate aspiration risk    Diet Recommendation   Dysphagia 1/thin  liquids  Medication Administration: Crushed with puree    Other  Recommendations Oral Care Recommendations: Oral care BID    Recommendations for follow up therapy are one component of a multi-disciplinary discharge planning process, led by the attending physician.  Recommendations may be updated based on patient status, additional functional criteria and insurance authorization.  Follow up Recommendations Other (comment) (tba)      Assistance Recommended at Discharge Frequent or constant Supervision/Assistance  Functional Status Assessment    Frequency and Duration min 2x/week  1 week       Prognosis Prognosis for Safe Diet Advancement: Fair      Swallow Study   General Date of Onset: 03-13-2021 HPI: 88 y.o. female presented from facility via EMS with RVR. Dx sepsis due to pna. PMH significant for afib; dementia; known dysphagia; HTN; and pacemaker placement. According to her daughter at bedside, pt moved from [place] two years ago. 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 Type of Study: Bedside Swallow Evaluation Previous Swallow Assessment: no Diet Prior to this Study: NPO Temperature Spikes Noted: Yes Respiratory Status: Nasal cannula History of Recent Intubation: No Behavior/Cognition: Alert;Pleasant mood Oral Cavity Assessment: Within Functional Limits Oral Care Completed by SLP: Recent completion by staff Oral Cavity - Dentition: Edentulous Self-Feeding Abilities: Needs assist Patient Positioning: Upright in bed Baseline Vocal Quality: Wet Volitional Cough: Strong;Congested Volitional Swallow: Able to elicit    Oral/Motor/Sensory Function Overall Oral Motor/Sensory Function: Within functional limits   Ice Chips Ice chips: Within functional limits  Thin Liquid Thin Liquid: Within functional limits    Nectar Thick Nectar Thick Liquid: Not tested   Honey Thick Honey Thick Liquid: Not tested   Puree Puree:  Within functional limits   Solid     Solid: Not tested     Marchelle Folks L. Samson Frederic, MA CCC/SLP Acute Rehabilitation Services Office number [phone] Pager [phone]  Blenda Mounts [facility] 02/13/2021,12:02 PM

## 2021-02-14 DIAGNOSIS — J189 Pneumonia, unspecified organism: Secondary | ICD-10-CM | POA: Diagnosis not present

## 2021-02-14 DIAGNOSIS — J9601 Acute respiratory failure with hypoxia: Secondary | ICD-10-CM | POA: Diagnosis not present

## 2021-02-14 DIAGNOSIS — I480 Paroxysmal atrial fibrillation: Secondary | ICD-10-CM | POA: Diagnosis not present

## 2021-02-14 DIAGNOSIS — A419 Sepsis, unspecified organism: Secondary | ICD-10-CM | POA: Diagnosis not present

## 2021-02-14 LAB — COMPREHENSIVE METABOLIC PANEL
ALT: 27 U/L (ref 0–44)
AST: 29 U/L (ref 15–41)
Albumin: 2.6 g/dL — ABNORMAL LOW (ref 3.5–5.0)
Alkaline Phosphatase: 43 U/L (ref 38–126)
Anion gap: 10 (ref 5–15)
BUN: 29 mg/dL — ABNORMAL HIGH (ref 8–23)
CO2: 20 mmol/L — ABNORMAL LOW (ref 22–32)
Calcium: 8.1 mg/dL — ABNORMAL LOW (ref 8.9–10.3)
Chloride: 111 mmol/L (ref 98–111)
Creatinine, Ser: 1.24 mg/dL — ABNORMAL HIGH (ref 0.44–1.00)
GFR, Estimated: 40 mL/min — ABNORMAL LOW (ref >60)
Glucose, Bld: 97 mg/dL (ref 70–99)
Potassium: 4.1 mmol/L (ref 3.5–5.1)
Sodium: 141 mmol/L (ref 135–145)
Total Bilirubin: 0.7 mg/dL (ref 0.3–1.2)
Total Protein: 5.1 g/dL — ABNORMAL LOW (ref 6.5–8.1)

## 2021-02-14 LAB — CBC
HCT: 32.7 % — ABNORMAL LOW (ref 36.0–46.0)
Hemoglobin: 10.4 g/dL — ABNORMAL LOW (ref 12.0–15.0)
MCH: 31.1 pg (ref 26.0–34.0)
MCHC: 31.8 g/dL (ref 30.0–36.0)
MCV: 97.9 fL (ref 80.0–100.0)
Platelets: 142 10*3/uL — ABNORMAL LOW (ref 150–400)
RBC: 3.34 MIL/uL — ABNORMAL LOW (ref 3.87–5.11)
RDW: 14.6 % (ref 11.5–15.5)
WBC: 5.2 10*3/uL (ref 4.0–10.5)
nRBC: 0 % (ref 0.0–0.2)

## 2021-02-14 LAB — URINE CULTURE: Culture: >=100000 — AB

## 2021-02-14 MED ORDER — HALOPERIDOL LACTATE 5 MG/ML IJ SOLN: 0.5000 mg | INTRAMUSCULAR | Status: DC | PRN

## 2021-02-14 MED ORDER — FUROSEMIDE 10 MG/ML IJ SOLN
20.0000 mg | Freq: Once | INTRAMUSCULAR | Status: AC
  Administered 2021-02-14: 20 mg via INTRAVENOUS
  Filled 2021-02-14: qty 2

## 2021-02-14 MED ORDER — GLYCOPYRROLATE 0.2 MG/ML IJ SOLN: 0.4000 mg | INTRAMUSCULAR | Status: DC | PRN

## 2021-02-14 MED ORDER — LORAZEPAM 2 MG/ML PO CONC: 1.0000 mg | ORAL | Status: DC | PRN

## 2021-02-14 MED ORDER — GLYCOPYRROLATE 1 MG PO TABS
1.0000 mg | ORAL_TABLET | ORAL | Status: DC | PRN
  Filled 2021-02-14: qty 1

## 2021-02-14 MED ORDER — LORAZEPAM 2 MG/ML IJ SOLN
1.0000 mg | INTRAMUSCULAR | Status: DC | PRN
  Administered 2021-02-14 – 2021-02-15 (×3): 1 mg via INTRAVENOUS
  Filled 2021-02-14 (×3): qty 1

## 2021-02-14 MED ORDER — IPRATROPIUM-ALBUTEROL 0.5-2.5 (3) MG/3ML IN SOLN
3.0000 mL | Freq: Three times a day (TID) | RESPIRATORY_TRACT | Status: DC
  Administered 2021-02-14 (×2): 3 mL via RESPIRATORY_TRACT
  Filled 2021-02-14 (×3): qty 3

## 2021-02-14 MED ORDER — ONDANSETRON HCL 4 MG/2ML IJ SOLN: 4.0000 mg | Freq: Four times a day (QID) | INTRAMUSCULAR | Status: DC | PRN

## 2021-02-14 MED ORDER — GLYCOPYRROLATE 0.2 MG/ML IJ SOLN: 0.2000 mg | INTRAMUSCULAR | Status: DC | PRN

## 2021-02-14 MED ORDER — MORPHINE BOLUS VIA INFUSION
2.0000 mg | INTRAVENOUS | Status: DC | PRN
  Administered 2021-02-15 (×6): 2 mg via INTRAVENOUS
  Filled 2021-02-14: qty 2

## 2021-02-14 MED ORDER — HALOPERIDOL LACTATE 2 MG/ML PO CONC
0.5000 mg | ORAL | Status: DC | PRN
  Filled 2021-02-14: qty 0.3

## 2021-02-14 MED ORDER — HALOPERIDOL 0.5 MG PO TABS
0.5000 mg | ORAL_TABLET | ORAL | Status: DC | PRN
  Filled 2021-02-14: qty 1

## 2021-02-14 MED ORDER — LORAZEPAM 1 MG PO TABS: 1.0000 mg | ORAL_TABLET | ORAL | Status: DC | PRN

## 2021-02-14 MED ORDER — ONDANSETRON 4 MG PO TBDP: 4.0000 mg | ORAL_TABLET | Freq: Four times a day (QID) | ORAL | Status: DC | PRN

## 2021-02-14 MED ORDER — POLYVINYL ALCOHOL 1.4 % OP SOLN
1.0000 [drp] | Freq: Four times a day (QID) | OPHTHALMIC | Status: DC | PRN
  Filled 2021-02-14: qty 15

## 2021-02-14 MED ORDER — ACETAMINOPHEN 325 MG PO TABS: 650.0000 mg | ORAL_TABLET | Freq: Four times a day (QID) | ORAL | Status: DC | PRN

## 2021-02-14 MED ORDER — ACETAMINOPHEN 650 MG RE SUPP
650.0000 mg | Freq: Four times a day (QID) | RECTAL | Status: DC | PRN
  Administered 2021-02-15: 650 mg via RECTAL
  Filled 2021-02-14: qty 1

## 2021-02-14 MED ORDER — GLYCOPYRROLATE 0.2 MG/ML IJ SOLN
0.2000 mg | INTRAMUSCULAR | Status: DC | PRN
  Administered 2021-02-15: 0.2 mg via INTRAVENOUS
  Filled 2021-02-14: qty 1

## 2021-02-14 MED ORDER — BIOTENE DRY MOUTH MT LIQD: 15.0000 mL | OROMUCOSAL | Status: DC | PRN

## 2021-02-14 MED ORDER — MORPHINE 100MG IN NS 100ML (1MG/ML) PREMIX INFUSION
6.0000 mg/h | INTRAVENOUS | Status: DC
  Administered 2021-02-14: 2 mg/h via INTRAVENOUS
  Administered 2021-02-15: 6 mg/h via INTRAVENOUS
  Filled 2021-02-14 (×3): qty 100

## 2021-02-14 MED ORDER — GLYCOPYRROLATE 0.2 MG/ML IJ SOLN
0.3000 mg | Freq: Four times a day (QID) | INTRAMUSCULAR | Status: DC | PRN
  Administered 2021-02-14 (×2): 0.3 mg via INTRAVENOUS
  Filled 2021-02-14 (×2): qty 2

## 2021-02-14 NOTE — Progress Notes (Signed)
Progress Note   Patient: Tiffany Cowan [id number] DOB: [birth year] DOA: 02/22/2021     2 DOS: the patient was seen and examined on 02/14/2021   Brief hospital course: 88 y/o female who is a resident of ALF, admitted with shortness of breath. Found to have sepsis related to pneumonia. Suspect aspiration pneumonia with history of dysphagia. Started on fluids and antibiotics. Also noted to be in rapid atrial fibrillation. Currently on amiodarone infusion. She is DNR. Family wish to continue current treatments for now, but are agreeable to symptom management with morphine for tachypnea. If patient fails to make meaningful improvements, they would consider transitioning to full comfort care.  Assessment and Plan: * Sepsis due to pneumonia [facility])- (present on admission) -Sepsis indicates life-threatening organ dysfunction with mortality >10%, caused by dysregulation to host response.   -SIRS criteria in this patient includes: Fever, tachycardia, tachypnea, hypoxia  -Patient has evidence of acute organ failure recurrent hypotension (SBP < 90 or MAP < 65 x 2 readings) that is not easily explained by another condition. -Blood cultures 1 out of 2 positive for staph epidermidis, suspect contaminant -urine culture positive for klebsiella and lactobacillus. -Sepsis protocol initiated -Patient had initial SBP <90/MAP <65 and so has received the 30 cc/kg IVF bolus. -Suspected source is PNA, possibly aspiration -Imaging is c/w aspiration PNA and she has known h/o dysphagia -Seen by speech therapy and started on dysphagia 1 diet -On Iv antibiotics with aztreonam and doxycycline -Standing Duonebs, prn Albuterol -prn Robitussin -Unfortunately, she has not made any meaningful improvements  Anasarca Developing significant swelling in extremities Suspect this may be causing some pulmonary edema as well BNP elevated on admission Holding further IV fluids for now Will give 1 dose of  Lasix  Hypothyroidism Continue synthroid  Acute respiratory failure with hypoxia (HCC) Currently on 4L of oxygen with increased respiratory effort and tachypnea Will try and wean down oxygen as tolerated Secondary to pneumonia Continue bronchodilator treatment She is now on morphine infusion which will be titrated for respiratory distress   DNR (do not resuscitate)- (present on admission) Goals of care readdressed with patient's daughter She reaffirmed that patient is DNR Due to lack of meaningful improvements, patient's persistent tachypnea and appearance of discomfort, her daughter feels it is reasonable to transition towards comfort measures She has received frequent doses of morphine today, but remains tachypneic Will start on morphine infusion which can be adjusted for respiratory distress If she stabilizes, can consider transition to residential hospice, otherwise would anticipate in-hospital death.  Pacemaker- (present on admission) -Noted  A-fib ([place])- (present on admission) -currently has RVR, likely precipitated by sepsis -holding home metoprolol due to soft blood pressures -she is on chronic amiodarone, this was changed to IV infusion -Also with pacer -Not on [facility] but is taking ASA 81 mg daily -Due to goals of care, amiodarone infusion has been discontinued  Dementia ([place])- (present on admission) -Fairly advanced dementia at baseline -Continue home meds - Remeron, Ativan, Abilify -Will order delirium precautions -NOK reports no issues with wandering or agitation  Dysphagia -Chronic in nature -With dementia, at increased risk for aspiration -Imaging today concerning for aspiration -seen by SLP and started on Dysphagia 1 diet   Hypertension- (present on admission) -BP remains soft, holding home dose of metoprolol        Subjective: She is somnolent today.  Does not answer questions.  Physical Exam: Vitals:   02/14/21 1100 02/14/21 1417 02/14/21 1616  02/14/21 1925  BP: 121/72 104/81 103/61 (!) 143/96  Pulse: (!) 153 (!) 138 (!) 123 62  Resp: (!) 36 (!) 26 (!) 24 (!) 25  Temp: 97.8 F (36.6 C)  98.9 F (37.2 C) 98.3 F (36.8 C)  TempSrc: Axillary  Axillary Axillary  SpO2: 97% 98% 98% 90%  Weight:      Height:       General exam: Somnolent Respiratory system: Bilateral rhonchi with increased respiratory effort Cardiovascular system: Irregular. No murmurs, rubs, gallops. Gastrointestinal system: Abdomen is nondistended, soft and nontender. No organomegaly or masses felt. Normal bowel sounds heard. Central nervous system: Somnolent Extremities: Anasarca is present Skin: No rashes, lesions or ulcers Psychiatry: somnolent   Data Reviewed:  Reviewed CBC and chemistry panels  Family Communication: Discussed with patient's daughter  Disposition: Status is: Inpatient Remains inpatient appropriate because: Continue symptom management at end-of-life          Planned Discharge Destination:  Residential hospice versus in-hospital death     Time spent: 40 minutes  Author: Kathie Dike, MD 02/14/2021 9:07 PM  For on call review [email or web address].

## 2021-02-14 NOTE — Progress Notes (Signed)
Speech Language Pathology Treatment: Dysphagia  Patient Details Name: Tiffany Cowan MRN: [id number] DOB: December 29, 1923 Today's Date: 02/14/2021 Time: 5027-7412 SLP Time Calculation (min) (ACUTE ONLY): 12 min  Assessment / Plan / Recommendation Clinical Impression  F/u after yesterday's clinical swallow evaluation.  Ms. Tiffany Cowan is lethargic this am.  Her daughter and the RN are at bedside.  She was rousable to accept several sips of liquids - there was some coughing noted, however, she was quite drowsy.  RR is improved today to the mid 20s, allowing the potential for better coordination of the respiratory/swallowing cycles.  Recommend continuing this course of cautious efforts to feed dysphagia 1 with thin liquids; assisting with careful hand-feeding; crushing meds; holding the tray if there is frequent coughing associated with PO intake. D/W RN and Ms. Tiffany Cowan -  SLP will continue to follow.    HPI HPI: 88 y.o. female presented from facility via EMS with RVR. Dx sepsis due to pna. PMH significant for afib; dementia; known dysphagia; HTN; and pacemaker placement. According to her daughter at bedside, pt moved from [place] two years ago. 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      SLP Plan  Continue with current plan of care      Recommendations for follow up therapy are one component of a multi-disciplinary discharge planning process, led by the attending physician.  Recommendations may be updated based on patient status, additional functional criteria and insurance authorization.    Recommendations  Diet recommendations: Dysphagia 1 (puree);Thin liquid Liquids provided via: Cup;Straw Medication Administration: Crushed with puree Supervision: Full supervision/cueing for compensatory strategies Compensations: Minimize environmental distractions;Small sips/bites Postural Changes and/or Swallow Maneuvers: Seated upright 90 degrees                 Oral Care Recommendations: Oral care BID Follow Up Recommendations:  (tba) Plan: Continue with current plan of care         Katelyne Galster L. Samson Frederic, MA CCC/SLP Acute Rehabilitation Services Office number [phone] Pager [phone]   [name]  02/14/2021, 11:57 AM

## 2021-02-14 NOTE — Progress Notes (Signed)
RT NOTE: RT attempted to give patient Duoneb nebulizer treatment. Patient became very agitated, pulling at mask and shouting "take this thing off". RT was able to console patient and give majority of medication by holding mask above patient's face before having to turn treatment off due to patient agitation. Vitals are stable. RT will continue to monitor.

## 2021-02-15 DIAGNOSIS — I4891 Unspecified atrial fibrillation: Secondary | ICD-10-CM | POA: Diagnosis not present

## 2021-02-15 DIAGNOSIS — J9601 Acute respiratory failure with hypoxia: Secondary | ICD-10-CM | POA: Diagnosis not present

## 2021-02-15 DIAGNOSIS — R601 Generalized edema: Secondary | ICD-10-CM | POA: Diagnosis not present

## 2021-02-15 DIAGNOSIS — J189 Pneumonia, unspecified organism: Secondary | ICD-10-CM | POA: Diagnosis not present

## 2021-02-15 LAB — CULTURE, BLOOD (ROUTINE X 2)

## 2021-02-15 MED ORDER — LORAZEPAM 1 MG PO TABS: 1.0000 mg | ORAL_TABLET | ORAL | Status: DC | PRN

## 2021-02-15 MED ORDER — ATROPINE SULFATE 1 % OP SOLN
2.0000 [drp] | Freq: Four times a day (QID) | OPHTHALMIC | Status: DC
  Administered 2021-02-15 – 2021-02-16 (×3): 2 [drp] via SUBLINGUAL
  Filled 2021-02-15: qty 2

## 2021-02-15 MED ORDER — LORAZEPAM 2 MG/ML IJ SOLN
2.0000 mg | INTRAMUSCULAR | Status: DC | PRN
Start: 2021-02-15 — End: 2021-02-16

## 2021-02-15 MED ORDER — LORAZEPAM 2 MG/ML PO CONC: 1.0000 mg | ORAL | Status: DC | PRN

## 2021-02-15 NOTE — Progress Notes (Signed)
Progress Note   Patient: Tiffany Cowan [id number] DOB: 10-10-1923 DOA: 02/22/2021     3 DOS: the patient was seen and examined on 02/15/2021   Brief hospital course: 88 y/o female who is a resident of ALF, admitted with shortness of breath. Found to have sepsis related to pneumonia. Suspect aspiration pneumonia with history of dysphagia. Started on fluids and antibiotics. Also noted to be in rapid atrial fibrillation.  She was started on amiodarone infusion. She is DNR.  After 24 to 48 hours of treatment, it was noted that patient was not making meaningful improvements.  Family wished to focus on quality of life and comfort.  She was transitioned to comfort measures.  Started on morphine infusion for respiratory distress.  Assessment and Plan: * Sepsis due to pneumonia [facility])- (present on admission) -Sepsis indicates life-threatening organ dysfunction with mortality >10%, caused by dysregulation to host response.   -SIRS criteria in this patient includes: Fever, tachycardia, tachypnea, hypoxia  -Patient has evidence of acute organ failure recurrent hypotension (SBP < 90 or MAP < 65 x 2 readings) that is not easily explained by another condition. -Blood cultures 1 out of 2 positive for staph epidermidis, suspect contaminant -urine culture positive for klebsiella and lactobacillus. -Sepsis protocol initiated -Patient had initial SBP <90/MAP <65 and so has received the 30 cc/kg IVF bolus. -Suspected source is PNA, possibly aspiration -Imaging is c/w aspiration PNA and she has known h/o dysphagia -Seen by speech therapy and started on dysphagia 1 diet -On Iv antibiotics with aztreonam and doxycycline -Standing Duonebs, prn Albuterol -prn Robitussin -Unfortunately, she has not made any meaningful improvements  Anasarca Developing significant swelling in extremities Suspect this may be causing some pulmonary edema as well BNP elevated on admission Holding further IV fluids for now Will give 1  dose of Lasix  Hypothyroidism Continue synthroid  Acute respiratory failure with hypoxia (HCC) Currently on 4L of oxygen with increased respiratory effort and tachypnea Will try and wean down oxygen as tolerated Secondary to pneumonia Continue bronchodilator treatment She is now on morphine infusion which will be titrated for respiratory distress   DNR (do not resuscitate)- (present on admission) Goals of care readdressed with patient's daughter She reaffirmed that patient is DNR Due to lack of meaningful improvements, patient's persistent tachypnea and appearance of discomfort, her daughter feels it is reasonable to transition towards comfort measures She has received frequent doses of morphine today, but remains tachypneic Will start on morphine infusion which can be adjusted for respiratory distress If she stabilizes, can consider transition to residential hospice, otherwise would anticipate in-hospital death.  Pacemaker- (present on admission) -Noted  A-fib ([place])- (present on admission) -currently has RVR, likely precipitated by sepsis -holding home metoprolol due to soft blood pressures -she is on chronic amiodarone, this was changed to IV infusion -Also with pacer -Not on [facility] but is taking ASA 81 mg daily -Due to goals of care, amiodarone infusion has been discontinued  Dementia ([place])- (present on admission) -Fairly advanced dementia at baseline -Continue home meds - Remeron, Ativan, Abilify -Will order delirium precautions -NOK reports no issues with wandering or agitation  Dysphagia -Chronic in nature -With dementia, at increased risk for aspiration -Imaging today concerning for aspiration -seen by SLP and started on Dysphagia 1 diet   Hypertension- (present on admission) -BP remains soft, holding home dose of metoprolol        Subjective: Unresponsive  Physical Exam: Vitals:   02/14/21 1925 02/15/21 0505 02/15/21 0731 02/15/21 2000  BP: Marland Kitchen)  143/96  (!) 98/42 (!) 100/54 (!) 99/52  Pulse: 62 (!) 55 68 67  Resp: (!) 25 20 16  (!) 8  Temp: 98.3 F (36.8 C) 100.2 F (37.9 C) (!) 102.9 F (39.4 C) (!) 101.6 F (38.7 C)  TempSrc: Axillary Oral Axillary Axillary  SpO2: 90% 96% 92% 92%  Weight:      Height:       General exam: Somnolent/unresponsive Respiratory system: Bilateral rhonchi.  Respiratory effort seems to be better than yesterday. Cardiovascular system: Irregular. No murmurs, rubs, gallops.    Data Reviewed:  There are no new results to review at this time.  Family Communication: Discussed with daughter and grandson at the bedside  Disposition: Status is: Inpatient Remains inpatient appropriate because: Continued management of end-of-life          Planned Discharge Destination:  Anticipate in-hospital death     Time spent: 30 minutes  Author: Kathie Dike, MD 02/15/2021 9:40 PM  For on call review [email or web address].

## 2021-02-15 NOTE — Progress Notes (Signed)
Pharmacy Antibiotic Note  Tiffany Cowan is a 88 y.o. female admitted on 02/15/2021 presenting with tachycardia, CXR with multifocal pna.  Pharmacy has been consulted for Aztreonam dosing.    Temp 102.22F, last WBC 2/4 WNL. CrCl today is 21 mL/min  Plan: Continue Aztreonam 1g IV q 8h Monitor renal function, clinical progression and LOT  Height: 5\' 2"  (157.5 cm) Weight: 56.7 kg (125 lb) (last documented weight) IBW/kg (Calculated) : 50.1  Temp (24hrs), Avg:99.6 F (37.6 C), Min:97.8 F (36.6 C), Max:102.9 F (39.4 C)  Recent Labs  Lab 02/11/2021 0844 02/13/2021 0845 02/24/2021 1022 02/13/21 0126 02/14/21 0206  WBC 6.1  --   --  7.7 5.2  CREATININE 1.11*  --   --  1.05* 1.24*  LATICACIDVEN  --  1.4 1.5  --   --      Estimated Creatinine Clearance: 21 mL/min (A) (by C-G formula based on SCr of 1.24 mg/dL (H)).    Allergies  Allergen Reactions   Azithromycin Itching and Nausea Only   Penicillins Anaphylaxis   Amoxicillin Other (See Comments)    Listed on [facility] 02/22/2021    Thank you for allowing pharmacy to participate in this patient's care.  04/12/21, PharmD PGY1 Pharmacy Resident 02/15/2021 10:09 AM Check [email or web address] for unit specific pharmacy number

## 2021-02-17 LAB — CULTURE, BLOOD (ROUTINE X 2): Culture: NO GROWTH

## 2021-03-11 NOTE — Death Summary Note (Signed)
DEATH SUMMARY   Patient Details  Name: Tiffany Cowan MRN: [id number] DOB: 1923-12-04 [id number], [facility]  Admission/Discharge Information   Admit Date:  03-14-2021  Date of Death: Date of Death: 2021/03/18  Time of Death: Time of Death: 55  Length of Stay: 4   Principle Cause of death: Sepsis due to pneumonia  Hospital Diagnoses: Principal Problem:   Sepsis due to pneumonia [facility]) Active Problems:   Hypertension   Dysphagia   Dementia ([place])   A-fib ([place])   Pacemaker   DNR (do not resuscitate)   Acute respiratory failure with hypoxia [facility])   Hypothyroidism   Anasarca   Hospital Course: 88 y/o female who is a resident of ALF, admitted with shortness of breath. Found to have sepsis related to pneumonia. Suspect aspiration pneumonia with history of dysphagia. Started on fluids and antibiotics. Also noted to be in rapid atrial fibrillation.  She was started on amiodarone infusion. She is DNR.  After 24 to 48 hours of treatment, it was noted that patient was not making meaningful improvements.  Family wished to focus on quality of life and comfort.  She was transitioned to comfort measures.  Started on morphine infusion for respiratory distress.  Patient subsequently passed away in the hospital  Assessment and Plan: * Sepsis due to pneumonia [facility])- (present on admission) -Sepsis indicates life-threatening organ dysfunction with mortality >10%, caused by dysregulation to host response.   -SIRS criteria in this patient includes: Fever, tachycardia, tachypnea, hypoxia  -Patient has evidence of acute organ failure recurrent hypotension (SBP < 90 or MAP < 65 x 2 readings) that is not easily explained by another condition. -Blood cultures 1 out of 2 positive for staph epidermidis, suspect contaminant -urine culture positive for klebsiella and lactobacillus. -Sepsis protocol initiated -Patient had initial SBP <90/MAP <65 and so has received the 30 cc/kg IVF bolus. -Suspected  source is PNA, possibly aspiration -Imaging is c/w aspiration PNA and she has known h/o dysphagia -Seen by speech therapy and started on dysphagia 1 diet -On Iv antibiotics with aztreonam and doxycycline -Standing Duonebs, prn Albuterol -prn Robitussin -Unfortunately, she has not made any meaningful improvements  Anasarca Developing significant swelling in extremities Suspect this may be causing some pulmonary edema as well BNP elevated on admission Holding further IV fluids for now Will give 1 dose of Lasix  Hypothyroidism Synthroid discontinued based on goals of care  Acute respiratory failure with hypoxia (HCC) Currently on 4L of oxygen with increased respiratory effort and tachypnea Will try and wean down oxygen as tolerated Secondary to pneumonia Continue bronchodilator treatment She is now on morphine infusion which will be titrated for respiratory distress   DNR (do not resuscitate)- (present on admission) Goals of care readdressed with patient's daughter She reaffirmed that patient is DNR Due to lack of meaningful improvements, patient's persistent tachypnea and appearance of discomfort, her daughter feels it is reasonable to transition towards comfort measures She was started on morphine infusion for respiratory distress Once being started on morphine, it was noted that she had significant improvement in respiratory rate and appears to be comfortable She eventually succumbed to her illness and passed away in the hospital  Pacemaker- (present on admission) -Noted  A-fib ([employer])- (present on admission) -currently has RVR, likely precipitated by sepsis -holding home metoprolol due to soft blood pressures -she is on chronic amiodarone, this was changed to IV infusion -Also with pacer -Not on [facility] but is taking ASA 81 mg daily -Due to goals of care,  amiodarone infusion has been discontinued  Dementia ([place])- (present on admission) -Fairly advanced dementia at  baseline   Dysphagia -Chronic in nature -With dementia, at increased risk for aspiration -Imaging today concerning for aspiration -seen by SLP and started on Dysphagia 1 diet   Hypertension- (present on admission) -BP remains soft, holding home dose of metoprolol         Procedures:   Consultations:   The results of significant diagnostics from this hospitalization (including imaging, microbiology, ancillary and laboratory) are listed below for reference.   Significant Diagnostic Studies: DG Chest Port 1 View  Result Date: 02/24/2021 CLINICAL DATA:  88 year old female with possible sepsis. EXAM: PORTABLE CHEST 1 VIEW COMPARISON:  Portable chest 11/14/2019. FINDINGS: Portable AP semi upright view at 0841 hours. Stable left chest dual lead cardiac pacemaker. Stable mild cardiomegaly, Calcified aortic atherosclerosis. Lower lung volumes. Patchy and confluent new bilateral perihilar opacity, upper lobe on the right and mid lung on the left. Vague similar left lung base opacity. No pneumothorax or pleural effusion. Background pulmonary vascularity appears to remain normal. No acute osseous abnormality identified. Paucity of bowel gas in the upper abdomen. IMPRESSION: 1. Multifocal bilateral patchy lung opacity suspicious for Bilateral Pneumonia. Aspiration also a consideration. No pleural effusion is evident. 2. Mild cardiomegaly.  Aortic Atherosclerosis (ICD10-I70.0). Electronically Signed   By: Genevie Ann M.D.   On: 02/13/2021 08:52    Microbiology: Recent Results (from the past 240 hour(s))  Resp Panel by RT-PCR (Flu A&B, Covid) Nasopharyngeal Swab     Status: None   Collection Time: 02/26/2021  8:22 AM   Specimen: Nasopharyngeal Swab; Nasopharyngeal(NP) swabs in vial transport medium  Result Value Ref Range Status   SARS Coronavirus 2 by RT PCR NEGATIVE NEGATIVE Final    Comment: (NOTE) SARS-CoV-2 target nucleic acids are NOT DETECTED.  The SARS-CoV-2 RNA is generally detectable in  upper respiratory specimens during the acute phase of infection. The lowest concentration of SARS-CoV-2 viral copies this assay can detect is 138 copies/mL. A negative result does not preclude SARS-Cov-2 infection and should not be used as the sole basis for treatment or other patient management decisions. A negative result may occur with  improper specimen collection/handling, submission of specimen other than nasopharyngeal swab, presence of viral mutation(s) within the areas targeted by this assay, and inadequate number of viral copies(<138 copies/mL). A negative result must be combined with clinical observations, patient history, and epidemiological information. The expected result is Negative.  Fact Sheet for Patients:  EntrepreneurPulse.com.au  Fact Sheet for Healthcare Providers:  IncredibleEmployment.be  This test is no t yet approved or cleared by the [place] FDA and  has been authorized for detection and/or diagnosis of SARS-CoV-2 by FDA under an Emergency Use Authorization (EUA). This EUA will remain  in effect (meaning this test can be used) for the duration of the COVID-19 declaration under Section 564(b)(1) of the Act, 21 U.S.C.section 360bbb-3(b)(1), unless the authorization is terminated  or revoked sooner.       Influenza A by PCR NEGATIVE NEGATIVE Final   Influenza B by PCR NEGATIVE NEGATIVE Final    Comment: (NOTE) The Xpert Xpress SARS-CoV-2/FLU/RSV plus assay is intended as an aid in the diagnosis of influenza from Nasopharyngeal swab specimens and should not be used as a sole basis for treatment. Nasal washings and aspirates are unacceptable for Xpert Xpress SARS-CoV-2/FLU/RSV testing.  Fact Sheet for Patients: EntrepreneurPulse.com.au  Fact Sheet for Healthcare Providers: IncredibleEmployment.be  This test is not yet approved or  cleared by the [place] and has been  authorized for detection and/or diagnosis of SARS-CoV-2 by FDA under an Emergency Use Authorization (EUA). This EUA will remain in effect (meaning this test can be used) for the duration of the COVID-19 declaration under Section 564(b)(1) of the Act, 21 U.S.C. section 360bbb-3(b)(1), unless the authorization is terminated or revoked.  Performed at [facility], [place] [address], [place], [place] [address]   Blood Culture (routine x 2)     Status: Abnormal   Collection Time: 03/08/2021  8:22 AM   Specimen: BLOOD LEFT FOREARM  Result Value Ref Range Status   Specimen Description BLOOD LEFT FOREARM  Final   Special Requests   Final    BOTTLES DRAWN AEROBIC AND ANAEROBIC Blood Culture results may not be optimal due to an excessive volume of blood received in culture bottles   Culture  Setup Time   Final    GRAM POSITIVE COCCI ANAEROBIC BOTTLE ONLY CRITICAL RESULT CALLED TO, READ BACK BY AND VERIFIED WITH: E,MARTIN PHARMD @1105  02/13/21 EB    Culture (A)  Final    STAPHYLOCOCCUS EPIDERMIDIS THE SIGNIFICANCE OF ISOLATING THIS ORGANISM FROM A SINGLE SET OF BLOOD CULTURES WHEN MULTIPLE SETS ARE DRAWN IS UNCERTAIN. PLEASE NOTIFY THE MICROBIOLOGY DEPARTMENT WITHIN ONE WEEK IF SPECIATION AND SENSITIVITIES ARE REQUIRED. Performed at [facility], [place] [address] [place], [place] [address]    Report Status 02/15/2021 FINAL  Final  Blood Culture ID Panel (Reflexed)     Status: Abnormal   Collection Time: 02/11/2021  8:22 AM  Result Value Ref Range Status   Enterococcus faecalis NOT DETECTED NOT DETECTED Final   Enterococcus Faecium NOT DETECTED NOT DETECTED Final   Listeria monocytogenes NOT DETECTED NOT DETECTED Final   Staphylococcus species DETECTED (A) NOT DETECTED Final    Comment: CRITICAL RESULT CALLED TO, READ BACK BY AND VERIFIED WITH: E,MARTIN PHARMD @1105  02/13/21 EB    Staphylococcus aureus (BCID) NOT DETECTED NOT DETECTED Final   Staphylococcus epidermidis DETECTED (A) NOT  DETECTED Final    Comment: Methicillin (oxacillin) resistant coagulase negative staphylococcus. Possible blood culture contaminant (unless isolated from more than one blood culture draw or clinical case suggests pathogenicity). No antibiotic treatment is indicated for blood  culture contaminants. CRITICAL RESULT CALLED TO, READ BACK BY AND VERIFIED WITH: E,MARTIN PHARMD @1105  02/13/21 EB    Staphylococcus lugdunensis NOT DETECTED NOT DETECTED Final   Streptococcus species NOT DETECTED NOT DETECTED Final   Streptococcus agalactiae NOT DETECTED NOT DETECTED Final   Streptococcus pneumoniae NOT DETECTED NOT DETECTED Final   Streptococcus pyogenes NOT DETECTED NOT DETECTED Final   A.calcoaceticus-baumannii NOT DETECTED NOT DETECTED Final   Bacteroides fragilis NOT DETECTED NOT DETECTED Final   Enterobacterales NOT DETECTED NOT DETECTED Final   Enterobacter cloacae complex NOT DETECTED NOT DETECTED Final   Escherichia coli NOT DETECTED NOT DETECTED Final   Klebsiella aerogenes NOT DETECTED NOT DETECTED Final   Klebsiella oxytoca NOT DETECTED NOT DETECTED Final   Klebsiella pneumoniae NOT DETECTED NOT DETECTED Final   Proteus species NOT DETECTED NOT DETECTED Final   Salmonella species NOT DETECTED NOT DETECTED Final   Serratia marcescens NOT DETECTED NOT DETECTED Final   Haemophilus influenzae NOT DETECTED NOT DETECTED Final   Neisseria meningitidis NOT DETECTED NOT DETECTED Final   Pseudomonas aeruginosa NOT DETECTED NOT DETECTED Final   Stenotrophomonas maltophilia NOT DETECTED NOT DETECTED Final   Candida albicans NOT DETECTED NOT DETECTED Final   Candida auris NOT  DETECTED NOT DETECTED Final   Candida glabrata NOT DETECTED NOT DETECTED Final   Candida krusei NOT DETECTED NOT DETECTED Final   Candida parapsilosis NOT DETECTED NOT DETECTED Final   Candida tropicalis NOT DETECTED NOT DETECTED Final   Cryptococcus neoformans/gattii NOT DETECTED NOT DETECTED Final   Methicillin resistance  mecA/C DETECTED (A) NOT DETECTED Final    Comment: CRITICAL RESULT CALLED TO, READ BACK BY AND VERIFIED WITH: E,MARTIN PHARMD @1105  02/13/21 EB Performed at [place] [address], [place], [place] [address]   Urine Culture     Status: Abnormal   Collection Time: 02/14/2021  8:23 AM   Specimen: In/Out Cath Urine  Result Value Ref Range Status   Specimen Description IN/OUT CATH URINE  Final   Special Requests NONE  Final   Culture (A)  Final    >=100,000 COLONIES/mL KLEBSIELLA PNEUMONIAE >=100,000 COLONIES/mL LACTOBACILLUS SPECIES Standardized susceptibility testing for this organism is not available. Performed at [facility], [place] [address] [place], [place] [address]    Report Status 02/14/2021 FINAL  Final   Organism ID, Bacteria KLEBSIELLA PNEUMONIAE (A)  Final      Susceptibility   Klebsiella pneumoniae - MIC*    AMPICILLIN >=32 RESISTANT Resistant     CEFAZOLIN <=4 SENSITIVE Sensitive     CEFEPIME <=0.12 SENSITIVE Sensitive     CEFTRIAXONE <=0.25 SENSITIVE Sensitive     CIPROFLOXACIN <=0.25 SENSITIVE Sensitive     GENTAMICIN <=1 SENSITIVE Sensitive     IMIPENEM <=0.25 SENSITIVE Sensitive     NITROFURANTOIN 128 RESISTANT Resistant     TRIMETH/SULFA <=20 SENSITIVE Sensitive     AMPICILLIN/SULBACTAM 16 INTERMEDIATE Intermediate     PIP/TAZO 8 SENSITIVE Sensitive     * >=100,000 COLONIES/mL KLEBSIELLA PNEUMONIAE  Blood Culture (routine x 2)     Status: None (Preliminary result)   Collection Time: 02/15/2021  8:27 AM   Specimen: BLOOD RIGHT FOREARM  Result Value Ref Range Status   Specimen Description BLOOD RIGHT FOREARM  Final   Special Requests   Final    BOTTLES DRAWN AEROBIC AND ANAEROBIC Blood Culture results may not be optimal due to an excessive volume of blood received in culture bottles   Culture   Final    NO GROWTH 4 DAYS Performed at [facility], [place] [address] [place], [place] [address]    Report Status PENDING  Incomplete    Time  spent: 20 minutes  Signed: Kathie Dike, MD 02-20-21

## 2021-03-11 NOTE — Progress Notes (Signed)
Pt on comfort care measures. Pt DNR. Family at bedside. Pt without pulse or respirations, verified by 2 RNs. Memon, MD made aware at this time.

## 2021-03-11 NOTE — TOC Initial Note (Signed)
Transition of Care (TOC) - Initial/Assessment Note  Marvetta Gibbons RN, BSN Transitions of Care Unit 4E- RN Case Manager See Treatment Team for direct phone #    Patient Details  Name: Tiffany Cowan MRN: [id number] Date of Birth: Apr 16, 1923  Transition of Care [facility]) CM/SW Contact:    Dawayne Patricia, RN Phone Number: March 06, 2021, 10:36 AM  Clinical Narrative:                 Pt admitted w/ sepsis from [place]- noted pt has now transitioned to [place]- anticipate hospital death.  Transition of Care Department [facility]) has reviewed patient and no TOC needs have been identified at this time. We will continue to monitor patient through interdisciplinary progression rounds. If new patient transition needs arise, please place a TOC consult.    Expected Discharge Plan: Expired Barriers to Discharge: No Barriers Identified   Patient Goals and CMS Choice Patient states their goals for this hospitalization and ongoing recovery are:: comfort care CMS Medicare.gov Compare Post Acute Care list provided to:: Patient Represenative (must comment) Choice offered to / list presented to : Adult Children  Expected Discharge Plan and Services Expected Discharge Plan: Expired       Living arrangements for the past 2 months: [place] ([place] [facility])                                      Prior Living Arrangements/Services Living arrangements for the past 2 months: [place] ([place] [facility])                     Activities of Daily Living      Permission Sought/Granted                  Emotional Assessment         Alcohol / Substance Use: Not Applicable Psych Involvement: No (comment)  Admission diagnosis:  Acute respiratory failure with hypoxia ([place]) [J96.01] Atrial fibrillation with RVR ([place]) [I48.91] Sepsis due to pneumonia ([place]) [J18.9, A41.9] Community acquired pneumonia of both  lungs [J18.9] Patient Active Problem List   Diagnosis Date Noted   Acute respiratory failure with hypoxia ([place]) 02/13/2021   Hypothyroidism 02/13/2021   Anasarca 02/13/2021   Sepsis due to pneumonia ([place]) 03/09/2021   Hypertension 03/10/2021   Dysphagia 02/25/2021   Dementia ([place]) 03/03/2021   A-fib ([place]) 03/09/2021   Pacemaker 03/08/2021   DNR (do not resuscitate) 03/10/2021   PCP:  Housecall[facility], [facility] Pharmacy:  No Pharmacies Listed    Social Determinants of Health (SDOH) Interventions    Readmission Risk Interventions No flowsheet data found.

## 2021-03-11 NOTE — Care Management Important Message (Signed)
Important Message  Patient Details  Name: Tiffany Cowan MRN: [id number] Date of Birth: 09-26-1923   Medicare Important Message Given:  Yes     Shelda Altes 02/22/21, 10:04 AM

## 2021-03-11 DEATH — deceased

## 2022-05-19 IMAGING — CT CT HEAD W/O CM
3 series · 15 of 46 positions shown, 18 images · non-contrast
Comparison: CT 05/13/2020

CLINICAL DATA: Increased tremors

EXAM:
CT HEAD WITHOUT CONTRAST
TECHNIQUE: Contiguous axial images were obtained from the base of the skull
through the vertex without intravenous contrast.

[Series 2: head wo · axial · 0.47mm/px · z∈[-137,-17]mm · 9 of 29 slices shown, 12 images]
[im 3/29  brain]
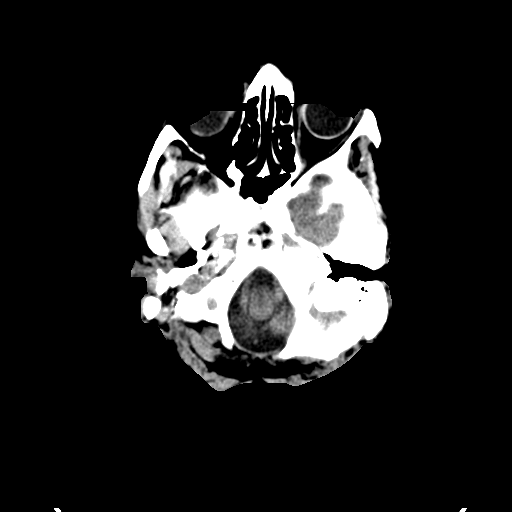
[im 3/29  bone]
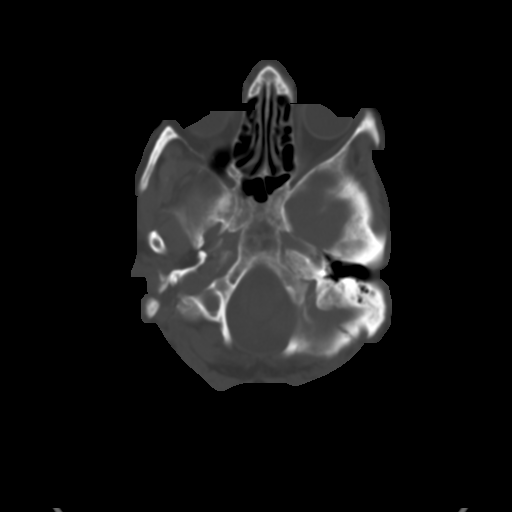
[im 6/29  brain]
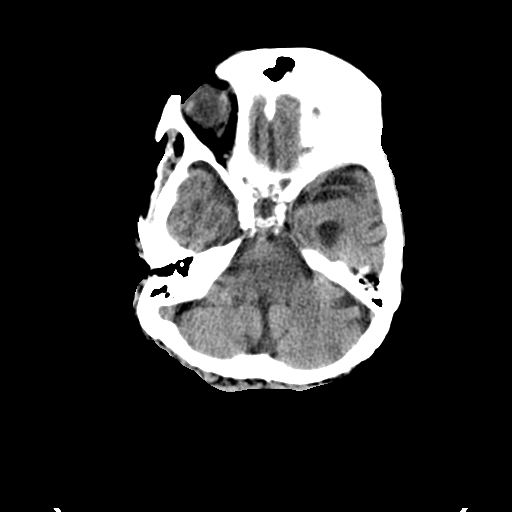
[im 9/29  brain]
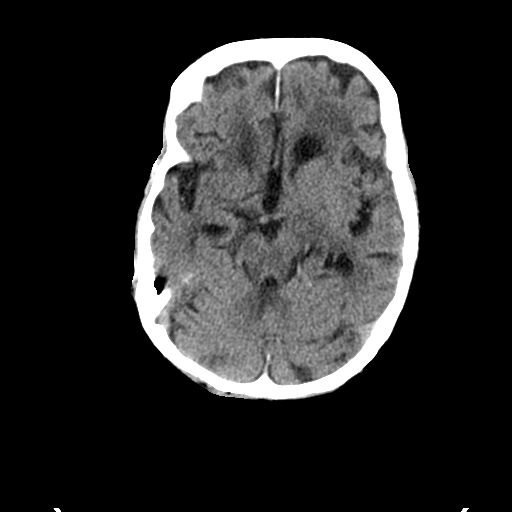
[im 12/29  brain]
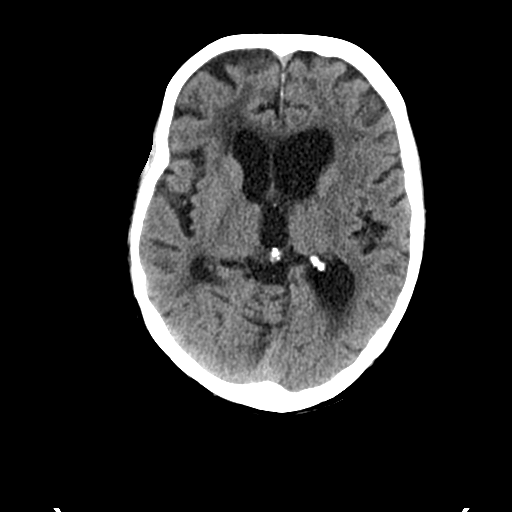
[im 15/29  brain]
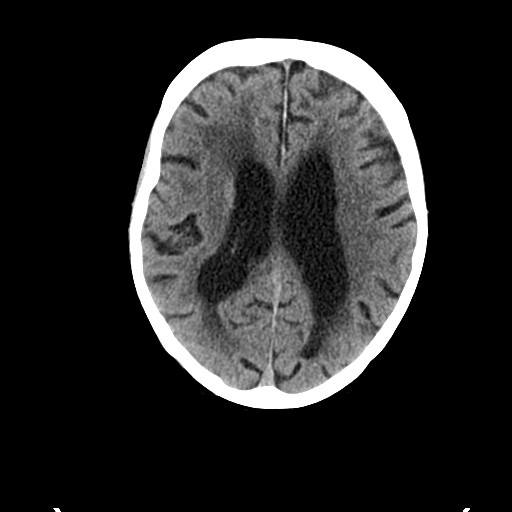
[im 15/29  bone]
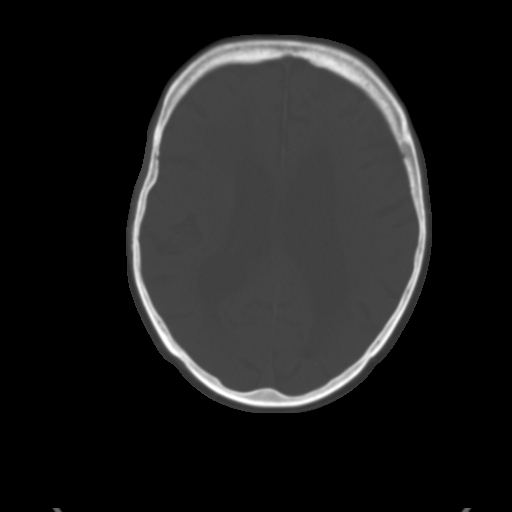
[im 18/29  brain]
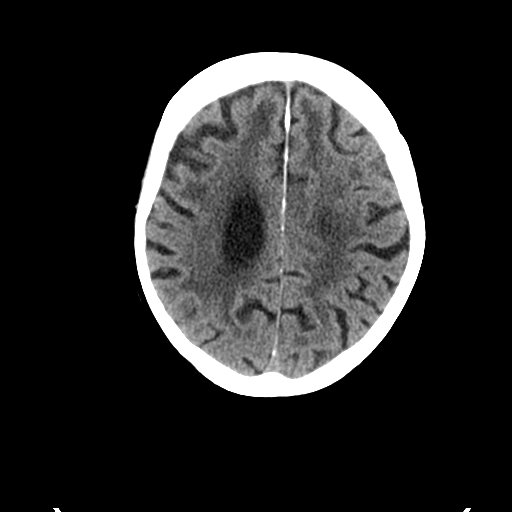
[im 21/29  brain]
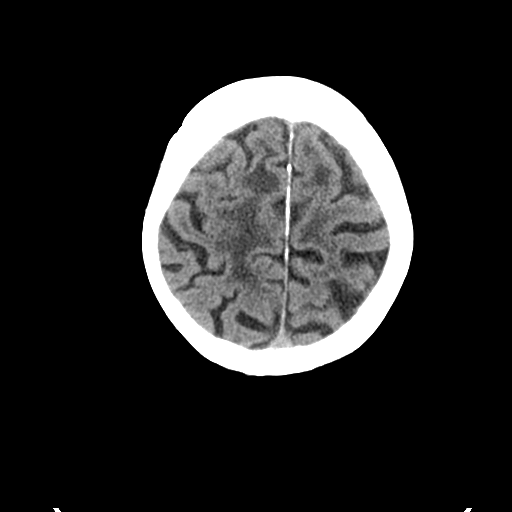
[im 24/29  brain]
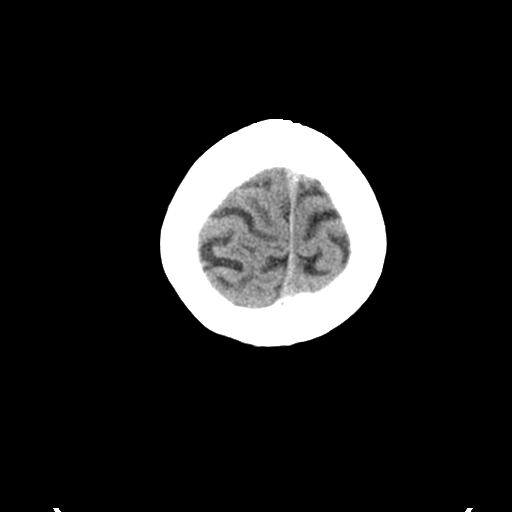
[im 27/29  brain]
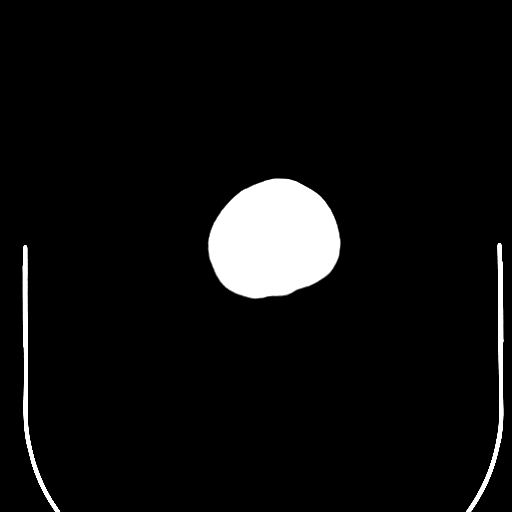
[im 27/29  bone]
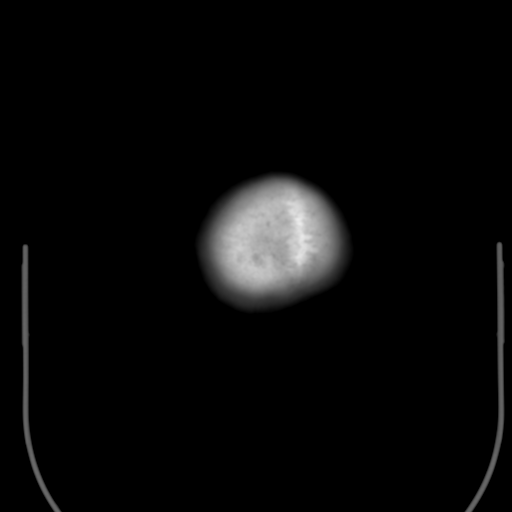

[Series 5: coronal soft tissue · coronal · 0.30mm/px · 3 of 68 slices shown]
[im 23/68  brain]
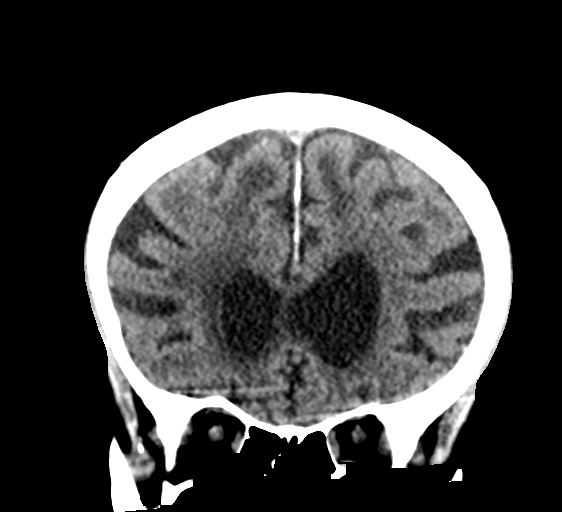
[im 30/68  brain]
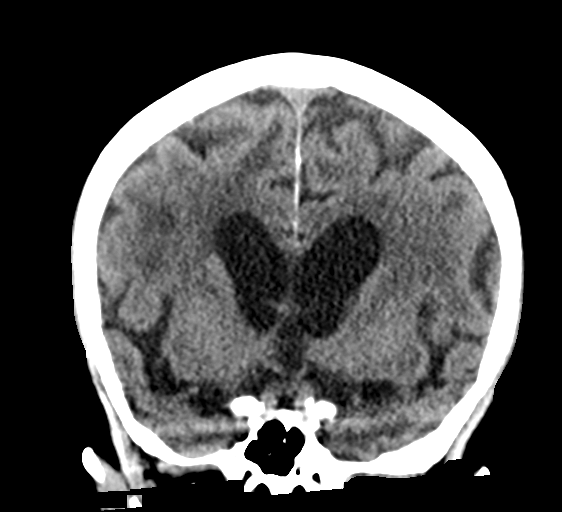
[im 38/68  brain]
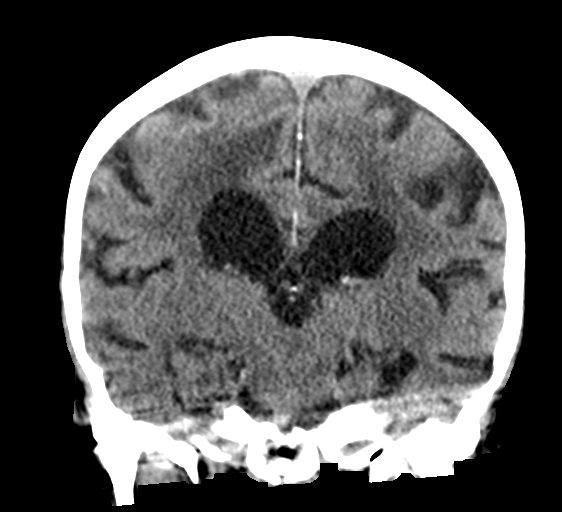

[Series 6: sagittal soft tissue · sagittal · 0.29mm/px · 3 of 53 slices shown]
[im 18/53  brain]
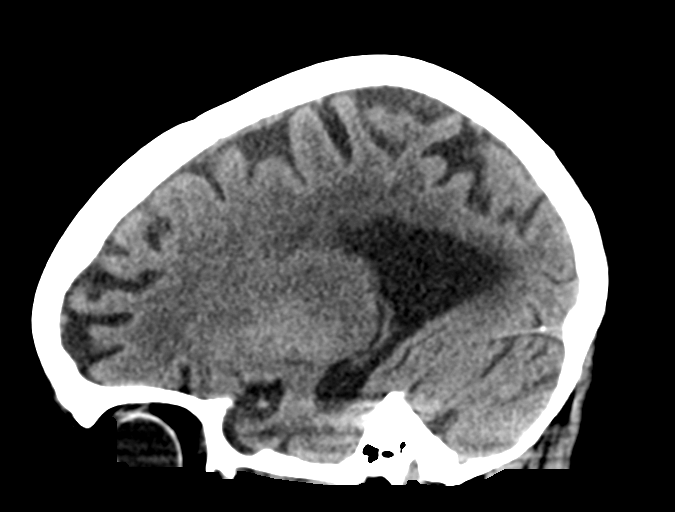
[im 27/53  brain]
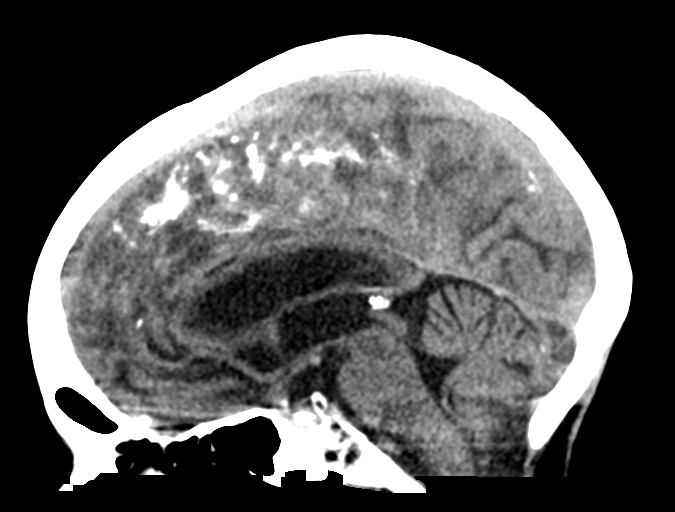
[im 35/53  brain]
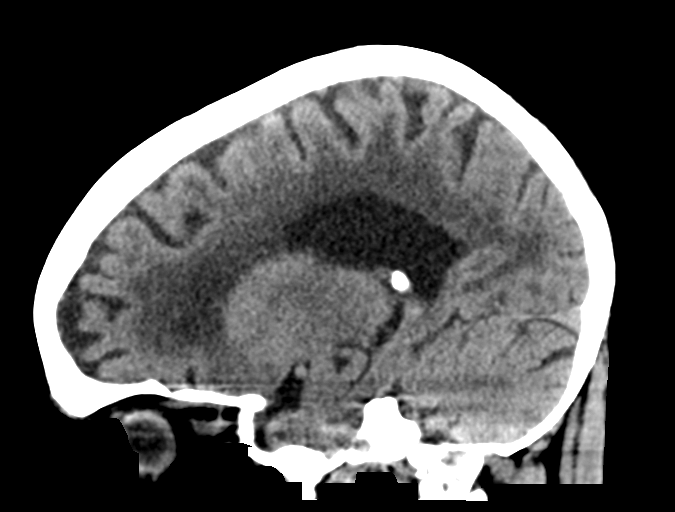

[15 of 46 positions shown; findings below may reference images not displayed]

FINDINGS: Brain: No acute territorial infarction, hemorrhage or intracranial
mass. Atrophy and extensive white matter hypodensity presumably
chronic small vessel ischemic change. Stable enlarged ventricles.

Vascular: No hyperdense vessels.  Carotid vascular calcification

Skull: Normal. Negative for fracture or focal lesion.

Sinuses/Orbits: No acute finding.

Other: None
IMPRESSION: 1. No CT evidence for acute intracranial abnormality.
2. Atrophy and chronic small vessel ischemic changes of the white
matter
